# Patient Record
Sex: Female | Born: 1970 | Race: White | Hispanic: No | Marital: Married | Smoking: Current every day smoker
Health system: Southern US, Community
[De-identification: ages and names within clinical notes are randomized; demographics above are authoritative.]

## PROBLEM LIST (undated history)

## (undated) DIAGNOSIS — R002 Palpitations: Secondary | ICD-10-CM

## (undated) DIAGNOSIS — N946 Dysmenorrhea, unspecified: Secondary | ICD-10-CM

## (undated) DIAGNOSIS — R0602 Shortness of breath: Secondary | ICD-10-CM

## (undated) DIAGNOSIS — E559 Vitamin D deficiency, unspecified: Secondary | ICD-10-CM

## (undated) DIAGNOSIS — M2550 Pain in unspecified joint: Secondary | ICD-10-CM

## (undated) DIAGNOSIS — N912 Amenorrhea, unspecified: Secondary | ICD-10-CM

## (undated) DIAGNOSIS — M549 Dorsalgia, unspecified: Secondary | ICD-10-CM

## (undated) DIAGNOSIS — K5900 Constipation, unspecified: Secondary | ICD-10-CM

## (undated) DIAGNOSIS — F419 Anxiety disorder, unspecified: Secondary | ICD-10-CM

## (undated) DIAGNOSIS — R32 Unspecified urinary incontinence: Secondary | ICD-10-CM

## (undated) HISTORY — DX: Amenorrhea, unspecified: N91.2

## (undated) HISTORY — DX: Vitamin D deficiency, unspecified: E55.9

## (undated) HISTORY — DX: Dorsalgia, unspecified: M54.9

## (undated) HISTORY — PX: CHOLECYSTECTOMY: SHX55

## (undated) HISTORY — DX: Constipation, unspecified: K59.00

## (undated) HISTORY — PX: COLONOSCOPY: SHX174

## (undated) HISTORY — DX: Dysmenorrhea, unspecified: N94.6

## (undated) HISTORY — DX: Unspecified urinary incontinence: R32

## (undated) HISTORY — DX: Pain in unspecified joint: M25.50

## (undated) HISTORY — DX: Shortness of breath: R06.02

## (undated) HISTORY — DX: Palpitations: R00.2

## (undated) HISTORY — PX: TUBAL LIGATION: SHX77

## (undated) HISTORY — DX: Anxiety disorder, unspecified: F41.9

---

## 1998-03-10 ENCOUNTER — Ambulatory Visit (HOSPITAL_COMMUNITY): Admission: RE | Admit: 1998-03-10 | Discharge: 1998-03-10 | Payer: Self-pay | Admitting: Obstetrics and Gynecology

## 1998-04-14 ENCOUNTER — Inpatient Hospital Stay (HOSPITAL_COMMUNITY): Admission: AD | Admit: 1998-04-14 | Discharge: 1998-04-14 | Payer: Self-pay | Admitting: Obstetrics & Gynecology

## 1998-06-05 ENCOUNTER — Ambulatory Visit (HOSPITAL_COMMUNITY): Admission: RE | Admit: 1998-06-05 | Discharge: 1998-06-05 | Payer: Self-pay | Admitting: Obstetrics and Gynecology

## 1998-06-10 ENCOUNTER — Inpatient Hospital Stay (HOSPITAL_COMMUNITY): Admission: AD | Admit: 1998-06-10 | Discharge: 1998-06-13 | Payer: Self-pay | Admitting: Obstetrics and Gynecology

## 1998-07-18 ENCOUNTER — Other Ambulatory Visit: Admission: RE | Admit: 1998-07-18 | Discharge: 1998-07-18 | Payer: Self-pay | Admitting: Obstetrics and Gynecology

## 1999-07-20 ENCOUNTER — Other Ambulatory Visit: Admission: RE | Admit: 1999-07-20 | Discharge: 1999-07-20 | Payer: Self-pay | Admitting: Obstetrics and Gynecology

## 2000-05-22 ENCOUNTER — Encounter: Admission: RE | Admit: 2000-05-22 | Discharge: 2000-05-22 | Payer: Self-pay | Admitting: Internal Medicine

## 2000-05-22 ENCOUNTER — Encounter: Payer: Self-pay | Admitting: Internal Medicine

## 2000-05-27 ENCOUNTER — Encounter: Payer: Self-pay | Admitting: Gastroenterology

## 2000-05-27 ENCOUNTER — Observation Stay (HOSPITAL_COMMUNITY): Admission: RE | Admit: 2000-05-27 | Discharge: 2000-05-28 | Payer: Self-pay

## 2000-11-10 ENCOUNTER — Other Ambulatory Visit: Admission: RE | Admit: 2000-11-10 | Discharge: 2000-11-10 | Payer: Self-pay | Admitting: Gastroenterology

## 2000-12-09 ENCOUNTER — Other Ambulatory Visit: Admission: RE | Admit: 2000-12-09 | Discharge: 2000-12-09 | Payer: Self-pay | Admitting: Obstetrics and Gynecology

## 2001-09-09 ENCOUNTER — Other Ambulatory Visit: Admission: RE | Admit: 2001-09-09 | Discharge: 2001-09-09 | Payer: Self-pay | Admitting: Obstetrics and Gynecology

## 2003-04-14 ENCOUNTER — Inpatient Hospital Stay (HOSPITAL_COMMUNITY): Admission: AD | Admit: 2003-04-14 | Discharge: 2003-04-30 | Payer: Self-pay | Admitting: Obstetrics and Gynecology

## 2003-04-14 ENCOUNTER — Encounter: Payer: Self-pay | Admitting: Obstetrics and Gynecology

## 2003-04-16 ENCOUNTER — Encounter: Payer: Self-pay | Admitting: Obstetrics and Gynecology

## 2003-04-18 ENCOUNTER — Encounter: Payer: Self-pay | Admitting: Obstetrics and Gynecology

## 2003-04-22 ENCOUNTER — Encounter: Payer: Self-pay | Admitting: Obstetrics and Gynecology

## 2003-04-26 ENCOUNTER — Encounter: Payer: Self-pay | Admitting: Obstetrics and Gynecology

## 2003-06-09 ENCOUNTER — Other Ambulatory Visit: Admission: RE | Admit: 2003-06-09 | Discharge: 2003-06-09 | Payer: Self-pay | Admitting: Obstetrics and Gynecology

## 2005-02-09 ENCOUNTER — Inpatient Hospital Stay (HOSPITAL_COMMUNITY): Admission: AD | Admit: 2005-02-09 | Discharge: 2005-02-20 | Payer: Self-pay | Admitting: Obstetrics

## 2005-02-21 ENCOUNTER — Encounter: Admission: RE | Admit: 2005-02-21 | Discharge: 2005-03-23 | Payer: Self-pay | Admitting: Obstetrics & Gynecology

## 2005-04-21 ENCOUNTER — Encounter: Admission: RE | Admit: 2005-04-21 | Discharge: 2005-05-21 | Payer: Self-pay | Admitting: Obstetrics & Gynecology

## 2005-06-21 ENCOUNTER — Encounter: Admission: RE | Admit: 2005-06-21 | Discharge: 2005-07-21 | Payer: Self-pay | Admitting: Obstetrics & Gynecology

## 2005-08-21 ENCOUNTER — Encounter: Admission: RE | Admit: 2005-08-21 | Discharge: 2005-08-28 | Payer: Self-pay | Admitting: Obstetrics & Gynecology

## 2012-10-21 DIAGNOSIS — R896 Abnormal cytological findings in specimens from other organs, systems and tissues: Secondary | ICD-10-CM | POA: Insufficient documentation

## 2014-03-31 ENCOUNTER — Telehealth: Payer: Self-pay

## 2014-03-31 NOTE — Telephone Encounter (Signed)
Unable to reach patient.  Invalid number.  NEW PATIENT

## 2014-04-04 ENCOUNTER — Ambulatory Visit: Payer: Self-pay | Admitting: Family Medicine

## 2014-04-04 DIAGNOSIS — Z0289 Encounter for other administrative examinations: Secondary | ICD-10-CM

## 2014-04-04 NOTE — Telephone Encounter (Signed)
No show, no call.

## 2014-11-30 ENCOUNTER — Ambulatory Visit (INDEPENDENT_AMBULATORY_CARE_PROVIDER_SITE_OTHER): Payer: No Typology Code available for payment source | Admitting: Emergency Medicine

## 2014-11-30 VITALS — BP 124/80 | HR 81 | Temp 98.4°F | Resp 16 | Ht 66.75 in | Wt 173.6 lb

## 2014-11-30 DIAGNOSIS — J40 Bronchitis, not specified as acute or chronic: Secondary | ICD-10-CM

## 2014-11-30 MED ORDER — BENZONATATE 100 MG PO CAPS: 100.0000 mg | ORAL_CAPSULE | Freq: Three times a day (TID) | ORAL | Status: DC | PRN

## 2014-11-30 MED ORDER — AZITHROMYCIN 250 MG PO TABS: ORAL_TABLET | ORAL | Status: DC

## 2014-11-30 NOTE — Progress Notes (Signed)
   Subjective:  This chart was scribed for Remo Lipps A. Fermin Yan MD by Molli Posey, Medical scribe. This patient was seen in ROOM 1 and the patient's care was started 2:42 PM.   Patient ID: Cheryl Watson, female    DOB: 1971/10/23, 43 y.o.   MRN: [id number]   Chief Complaint  Patient presents with  . Flu Vaccine  . Fever    up to 100 last night  . Sinusitis    x2 days  . Cough    greenish sputum    HPI HPI Comments: Cheryl Watson is a 43 y.o. female who presents to [facility] complaining of sinus pressure that started 2 days ago. She reports an associated, worsening productive cough with thick green phlegm. Pt reports she sometimes feels like she is wheezing. She states that she had a fever last night. She says she took some advil cold and sinus that provided minimal relief to her symptoms. Pt states she has never had to use an inhaler for prior episodes. Pt reports she has a history of bronchitis and gets it about once a year. She says that her son was sick last week. She says that she has taken a Z-pak in the past with success. Patient reports she smokes 0.5ppd.   There are no active problems to display for this patient.  History reviewed. No pertinent past medical history. No current outpatient prescriptions on file prior to visit.   No current facility-administered medications on file prior to visit.   Allergies  Allergen Reactions  . Penicillins     All cilin drugs   Review of Systems  Constitutional: Positive for fever.  HENT: Positive for congestion.   Respiratory: Positive for cough.       Objective:   Physical Exam  Constitutional: She is oriented to person, place, and time. She appears well-developed and well-nourished.  HENT:  Head: Normocephalic and atraumatic.  Right Ear: External ear normal.  Left Ear: External ear normal.  Mouth/Throat: Oropharynx is clear and moist.  Nasal congestion.   Neck: Neck supple. No tracheal deviation present.  Cardiovascular:  Normal rate.   Pulmonary/Chest: Effort normal. No respiratory distress. She has no wheezes.  Few rhonchi on the right. Breath sounds symmetrical.   Abdominal: She exhibits no distension.  Neurological: She is alert and oriented to person, place, and time.  Skin: Skin is warm and dry.  Psychiatric: She has a normal mood and affect. Her behavior is normal.  Nursing note and vitals reviewed.  Filed Vitals:   11/30/14 1402  BP: 124/80  Pulse: 81  Temp: 98.4 F (36.9 C)  TempSrc: Oral  Resp: 16  Height: 5' 6.75" (1.695 m)  Weight: 173 lb 9.6 oz (78.744 kg)  SpO2: 99%   Meds ordered this encounter  Medications  . azithromycin (ZITHROMAX) 250 MG tablet    Sig: Take 2 tabs PO x 1 dose, then 1 tab PO QD x 4 days    Dispense:  6 tablet    Refill:  0  . benzonatate (TESSALON) 100 MG capsule    Sig: Take 1-2 capsules (100-200 mg total) by mouth 3 (three) times daily as needed for cough.    Dispense:  40 capsule    Refill:  0      Assessment & Plan:  We'll treat with Tessalon Perles and Zithromax.I personally performed the services described in this documentation, which was scribed in my presence. The recorded information has been reviewed and is accurate.

## 2014-11-30 NOTE — Progress Notes (Deleted)
   Subjective:    Patient ID: Cheryl Watson, female    DOB: 17-Apr-1971, 43 y.o.   MRN: [id number]  HPI    Review of Systems     Objective:   Physical Exam        Assessment & Plan:

## 2014-11-30 NOTE — Patient Instructions (Signed)

## 2015-08-09 ENCOUNTER — Telehealth: Payer: Self-pay | Admitting: *Deleted

## 2015-08-09 NOTE — Telephone Encounter (Signed)
Unable to reach patient at time of Pre-Visit Call.  Left message for patient to return call when available.    

## 2015-08-10 ENCOUNTER — Ambulatory Visit: Payer: Self-pay | Admitting: Family Medicine

## 2015-10-22 ENCOUNTER — Ambulatory Visit (INDEPENDENT_AMBULATORY_CARE_PROVIDER_SITE_OTHER): Payer: PRIVATE HEALTH INSURANCE | Admitting: Internal Medicine

## 2015-10-22 VITALS — BP 110/66 | HR 94 | Temp 98.4°F | Resp 20 | Ht 66.0 in | Wt 171.8 lb

## 2015-10-22 DIAGNOSIS — N393 Stress incontinence (female) (male): Secondary | ICD-10-CM

## 2015-10-22 DIAGNOSIS — R32 Unspecified urinary incontinence: Secondary | ICD-10-CM

## 2015-10-22 LAB — POCT WET PREP WITH KOH
CLUE CELLS WET PREP PER HPF POC: NEGATIVE
KOH Prep POC: NEGATIVE
RBC WET PREP PER HPF POC: NEGATIVE
TRICHOMONAS UA: NEGATIVE
Yeast Wet Prep HPF POC: NEGATIVE

## 2015-10-22 LAB — POCT URINALYSIS DIPSTICK
Bilirubin, UA: NEGATIVE
Glucose, UA: NEGATIVE
Ketones, UA: NEGATIVE
Leukocytes, UA: NEGATIVE
NITRITE UA: NEGATIVE
PH UA: 7
Protein, UA: NEGATIVE
Spec Grav, UA: 1.015
UROBILINOGEN UA: 0.2

## 2015-10-22 LAB — POCT UA - MICROSCOPIC ONLY
CASTS, UR, LPF, POC: ABSENT
Crystals, Ur, HPF, POC: ABSENT
Mucus, UA: ABSENT
YEAST UA: ABSENT

## 2015-10-22 NOTE — Progress Notes (Signed)
Subjective:  This chart was scribed for Tami Lin, MD by Thea Alken, ED Scribe. This patient was seen in room 4 and the patient's care was started at 2:06 PM.   Patient ID: Cheryl Watson, female    DOB: Jul 31, 1971, 44 y.o.   MRN: [id number]  HPI Chief Complaint  Patient presents with  . OTHER    bladder infection, yesterday  . Dysmenorrhea     x4 days  . Flu Vaccine   HPI Comments: Cheryl Watson is a 44 y.o. female who presents to the [facility] complaining of a bladder issue. pt states within the past couple weeks she's had bladder incontinence consisting of leaking urine when changing position, sitting to standing. States for 2-3 days she's had some abdominal cramping, low back discomfort, vaginal discomfort, urinary frequency, and bladder fullness. Today while sitting up on the toilet he felt that something had protruded into her vaginal area that she could feel with her fingers. Her husband passed away 2 y ago, and she has just resumed sexual activity within the last 3 months.She denies dyspareunia, discharge.  Last pap smear was 2 years. LMP-October 19th. She has hx of tubal ligation.   She denies hx of back pain or leg weakness/numbness. No constip or diarr.  History reviewed. No pertinent past medical history. Allergies  Allergen Reactions  . Penicillins     All cilin drugs   Prior to Admission medications =none  Medication Sig Start Date End Date Taking? Authorizing Provider   Review of Systems  Constitutional: Negative for fever and chills.  Genitourinary: Positive for frequency, vaginal discharge and enuresis. Negative for dysuria, vaginal bleeding, vaginal pain and dyspareunia.  Musculoskeletal: Positive for myalgias and back pain.  Neurological: Negative for weakness.       Objective:   Physical Exam  Constitutional: She is oriented to person, place, and time. She appears well-developed and well-nourished. No distress.  HENT:    Head: Normocephalic.  Cardiovascular: Normal rate.   Pulmonary/Chest: Effort normal.  Abdominal: Bowel sounds are normal. She exhibits no distension and no mass. There is no tenderness. There is no rebound.  Genitourinary:  intr clear Vault without prolapse lying Oc clear Uterus midposition, nontender No adnex masses or tend  Musculoskeletal: Normal range of motion.  Neurological: She is alert and oriented to person, place, and time.  Skin: Skin is warm and dry.  Psychiatric: She has a normal mood and affect. Her behavior is normal.  Nursing note and vitals reviewed.    Filed Vitals:   10/22/15 1329  BP: 110/66  Pulse: 94  Temp: 98.4 F (36.9 C)  TempSrc: Oral  Resp: 20  Height: 5\' 6"  (1.676 m)  Weight: 171 lb 12.8 oz (77.928 kg)  SpO2: 98%     Results for orders placed or performed in visit on 10/22/15  POCT UA - Microscopic Only  Result Value Ref Range   WBC, Ur, HPF, POC Few    RBC, urine, microscopic None    Bacteria, U Microscopic Few    Mucus, UA Absent    Epithelial cells, urine per micros Moderate    Crystals, Ur, HPF, POC Absent    Casts, Ur, LPF, POC Absent    Yeast, UA Absent   POCT urinalysis dipstick  Result Value Ref Range   Color, UA yellow    Clarity, UA clear    Glucose, UA negatve    Bilirubin, UA negative    Ketones, UA negative  Spec Grav, UA 1.015    Blood, UA trace-lysed    pH, UA 7.0    Protein, UA negative    Urobilinogen, UA 0.2    Nitrite, UA negative    Leukocytes, UA Negative Negative  POCT Wet Prep with KOH  Result Value Ref Range   Trichomonas, UA Negative    Clue Cells Wet Prep HPF POC Negative    Epithelial Wet Prep HPF POC Many Few, Moderate, Many, Too numerous to count   Yeast Wet Prep HPF POC Negative    Bacteria Wet Prep HPF POC Moderate (A) None, Few, Too numerous to count   RBC Wet Prep HPF POC Negative    WBC Wet Prep HPF POC Few    KOH Prep POC Negative     Assessment & Plan:    1. Incontinence in female    new onset  The clinical history suggests that she has a uterine prolapse and we will refer her to gynecology for evaluation   I have completed the patient encounter in its entirety as documented by the scribe, with editing by me where necessary. Judee Hennick P. Laney Pastor, M.D.

## 2015-11-01 ENCOUNTER — Ambulatory Visit (INDEPENDENT_AMBULATORY_CARE_PROVIDER_SITE_OTHER): Payer: PRIVATE HEALTH INSURANCE | Admitting: Women's Health

## 2015-11-01 ENCOUNTER — Other Ambulatory Visit (HOSPITAL_COMMUNITY)
Admission: RE | Admit: 2015-11-01 | Discharge: 2015-11-01 | Disposition: A | Discharge status: Home / Self Care | Payer: Managed Care, Other (non HMO) | Source: Ambulatory Visit | Attending: Women's Health | Admitting: Women's Health

## 2015-11-01 ENCOUNTER — Encounter: Payer: Self-pay | Admitting: Women's Health

## 2015-11-01 VITALS — BP 124/80 | Ht 66.0 in | Wt 173.0 lb

## 2015-11-01 DIAGNOSIS — Z1151 Encounter for screening for human papillomavirus (HPV): Secondary | ICD-10-CM | POA: Insufficient documentation

## 2015-11-01 DIAGNOSIS — Z23 Encounter for immunization: Secondary | ICD-10-CM

## 2015-11-01 DIAGNOSIS — Z01411 Encounter for gynecological examination (general) (routine) with abnormal findings: Secondary | ICD-10-CM | POA: Insufficient documentation

## 2015-11-01 DIAGNOSIS — Z01419 Encounter for gynecological examination (general) (routine) without abnormal findings: Secondary | ICD-10-CM | POA: Diagnosis not present

## 2015-11-01 DIAGNOSIS — Z113 Encounter for screening for infections with a predominantly sexual mode of transmission: Secondary | ICD-10-CM

## 2015-11-01 LAB — COMPREHENSIVE METABOLIC PANEL
ALBUMIN: 4 g/dL (ref 3.6–5.1)
ALK PHOS: 52 U/L (ref 33–115)
ALT: 13 U/L (ref 6–29)
AST: 16 U/L (ref 10–35)
BUN: 15 mg/dL (ref 7–25)
CALCIUM: 8.8 mg/dL (ref 8.6–10.2)
CHLORIDE: 105 mmol/L (ref 98–110)
CO2: 26 mmol/L (ref 20–31)
Creat: 0.6 mg/dL (ref 0.50–1.10)
Glucose, Bld: 76 mg/dL (ref 65–99)
POTASSIUM: 4 mmol/L (ref 3.5–5.3)
Sodium: 139 mmol/L (ref 135–146)
TOTAL PROTEIN: 6.3 g/dL (ref 6.1–8.1)
Total Bilirubin: 0.3 mg/dL (ref 0.2–1.2)

## 2015-11-01 LAB — CBC WITH DIFFERENTIAL/PLATELET
BASOS ABS: 0.1 10*3/uL (ref 0.0–0.1)
Basophils Relative: 1 % (ref 0–1)
Eosinophils Absolute: 0.3 10*3/uL (ref 0.0–0.7)
Eosinophils Relative: 3 % (ref 0–5)
HCT: 37.4 % (ref 36.0–46.0)
Hemoglobin: 12.8 g/dL (ref 12.0–15.0)
LYMPHS ABS: 3.7 10*3/uL (ref 0.7–4.0)
Lymphocytes Relative: 35 % (ref 12–46)
MCH: 30.9 pg (ref 26.0–34.0)
MCHC: 34.2 g/dL (ref 30.0–36.0)
MCV: 90.3 fL (ref 78.0–100.0)
MPV: 10.7 fL (ref 8.6–12.4)
Monocytes Absolute: 0.7 10*3/uL (ref 0.1–1.0)
Monocytes Relative: 7 % (ref 3–12)
NEUTROS PCT: 54 % (ref 43–77)
Neutro Abs: 5.7 10*3/uL (ref 1.7–7.7)
PLATELETS: 265 10*3/uL (ref 150–400)
RBC: 4.14 MIL/uL (ref 3.87–5.11)
RDW: 13.5 % (ref 11.5–15.5)
WBC: 10.6 10*3/uL — ABNORMAL HIGH (ref 4.0–10.5)

## 2015-11-01 LAB — LIPID PANEL
CHOL/HDL RATIO: 3.7 ratio (ref <=5.0)
CHOLESTEROL: 169 mg/dL (ref 125–200)
HDL: 46 mg/dL (ref >=46)
LDL Cholesterol: 96 mg/dL (ref <130)
TRIGLYCERIDES: 135 mg/dL (ref <150)
VLDL: 27 mg/dL (ref <30)

## 2015-11-01 LAB — TSH: TSH: 1.246 u[IU]/mL (ref 0.350–4.500)

## 2015-11-01 NOTE — Progress Notes (Signed)
Yan Okray Ring Apr 23, 1971 [id number]    History:    Presents for new patient annual exam.  Regular monthly cycle/BTL/new partner. One abnormal Pap many years ago normal after that. Normal mammograms overdue. Was treated for UTI 2 weeks ago and was noted to have a questionable uterine prolapse. Reports intermittent vaginal pressure, no urinary symptoms.  Past medical history, past surgical history, family history and social history were all reviewed and documented in the EPIC chart. Nurse in hospice. Has 3 children ages 60, 10, 47 all doing well. Widowed 2 years ago husband died of alcohol related cirrhosis. Engaged planning marriage in next year.  ROS:  A ROS was performed and pertinent positives and negatives are included.  Exam:  Filed Vitals:   11/01/15 1155  BP: 124/80    General appearance:  Normal Thyroid:  Symmetrical, normal in size, without palpable masses or nodularity. Respiratory  Auscultation:  Clear without wheezing or rhonchi Cardiovascular  Auscultation:  Regular rate, without rubs, murmurs or gallops  Edema/varicosities:  Not grossly evident Abdominal  Soft,nontender, without masses, guarding or rebound.  Liver/spleen:  No organomegaly noted  Hernia:  None appreciated  Skin  Inspection:  Grossly normal   Breasts: Examined lying and sitting.     Right: Without masses, retractions, discharge or axillary adenopathy.     Left: Without masses, retractions, discharge or axillary adenopathy. Gentitourinary   Inguinal/mons:  Normal without inguinal adenopathy  External genitalia: +1 cystocele with bearing down no visible prolapse, no leakage of urine  BUS/Urethra/Skene's glands:  Normal  Vagina:  Normal  Cervix:  Normal  Uterus:   normal in size, shape and contour.  Midline and mobile  Adnexa/parametria:     Rt: Without masses or tenderness.   Lt: Without masses or tenderness.  Anus and perineum: Normal  Digital rectal exam: Normal sphincter tone without palpated  masses or tenderness  Assessment/Plan:  44 y.o. WW at G3 P3  for annual exam.    Monthly cycle/BTL STD screen  Plan: SBE's, reviewed importance of annual screening mammogram. Regular exercise, calcium rich foods, vitamin D 1000 daily encouraged. CBC, CMP, lipid panel, TSH, UA, Pap with HR HPV typing, new screening guidelines reviewed. GC/Chlamydia, denies need for HIV, hepatitis or RPR. Reviewed importance of keeping exercise, emptying bladder prior to exercise, call if continued issues.   Huel Cote WHNP, 2:09 PM 11/01/2015

## 2015-11-01 NOTE — Patient Instructions (Signed)
Health Maintenance, Female Adopting a healthy lifestyle and getting preventive care can go a long way to promote health and wellness. Talk with your health care provider about what schedule of regular examinations is right for you. This is a good chance for you to check in with your provider about disease prevention and staying healthy. In between checkups, there are plenty of things you can do on your own. Experts have done a lot of research about which lifestyle changes and preventive measures are most likely to keep you healthy. Ask your health care provider for more information. WEIGHT AND DIET  Eat a healthy diet  Be sure to include plenty of vegetables, fruits, low-fat dairy products, and lean protein.  Do not eat a lot of foods high in solid fats, added sugars, or salt.  Get regular exercise. This is one of the most important things you can do for your health.  Most adults should exercise for at least 150 minutes each week. The exercise should increase your heart rate and make you sweat (moderate-intensity exercise).  Most adults should also do strengthening exercises at least twice a week. This is in addition to the moderate-intensity exercise.  Maintain a healthy weight  Body mass index (BMI) is a measurement that can be used to identify possible weight problems. It estimates body fat based on height and weight. Your health care provider can help determine your BMI and help you achieve or maintain a healthy weight.  For females 20 years of age and older:   A BMI below 18.5 is considered underweight.  A BMI of 18.5 to 24.9 is normal.  A BMI of 25 to 29.9 is considered overweight.  A BMI of 30 and above is considered obese.  Watch levels of cholesterol and blood lipids  You should start having your blood tested for lipids and cholesterol at 45 years of age, then have this test every 5 years.  You may need to have your cholesterol levels checked more often if:  Your lipid  or cholesterol levels are high.  You are older than 45 years of age.  You are at high risk for heart disease.  CANCER SCREENING   Lung Cancer  Lung cancer screening is recommended for adults 55-80 years old who are at high risk for lung cancer because of a history of smoking.  A yearly low-dose CT scan of the lungs is recommended for people who:  Currently smoke.  Have quit within the past 15 years.  Have at least a 30-pack-year history of smoking. A pack year is smoking an average of one pack of cigarettes a day for 1 year.  Yearly screening should continue until it has been 15 years since you quit.  Yearly screening should stop if you develop a health problem that would prevent you from having lung cancer treatment.  Breast Cancer  Practice breast self-awareness. This means understanding how your breasts normally appear and feel.  It also means doing regular breast self-exams. Let your health care provider know about any changes, no matter how small.  If you are in your 20s or 30s, you should have a clinical breast exam (CBE) by a health care provider every 1-3 years as part of a regular health exam.  If you are 40 or older, have a CBE every year. Also consider having a breast X-ray (mammogram) every year.  If you have a family history of breast cancer, talk to your health care provider about genetic screening.  If you   are at high risk for breast cancer, talk to your health care provider about having an MRI and a mammogram every year.  Breast cancer gene (BRCA) assessment is recommended for women who have family members with BRCA-related cancers. BRCA-related cancers include:  Breast.  Ovarian.  Tubal.  Peritoneal cancers.  Results of the assessment will determine the need for genetic counseling and BRCA1 and BRCA2 testing. Cervical Cancer Your health care provider may recommend that you be screened regularly for cancer of the pelvic organs (ovaries, uterus, and  vagina). This screening involves a pelvic examination, including checking for microscopic changes to the surface of your cervix (Pap test). You may be encouraged to have this screening done every 3 years, beginning at age 73.  For women ages 44-65, health care providers may recommend pelvic exams and Pap testing every 3 years, or they may recommend the Pap and pelvic exam, combined with testing for human papilloma virus (HPV), every 5 years. Some types of HPV increase your risk of cervical cancer. Testing for HPV may also be done on women of any age with unclear Pap test results.  Other health care providers may not recommend any screening for nonpregnant women who are considered low risk for pelvic cancer and who do not have symptoms. Ask your health care provider if a screening pelvic exam is right for you.  If you have had past treatment for cervical cancer or a condition that could lead to cancer, you need Pap tests and screening for cancer for at least 20 years after your treatment. If Pap tests have been discontinued, your risk factors (such as having a new sexual partner) need to be reassessed to determine if screening should resume. Some women have medical problems that increase the chance of getting cervical cancer. In these cases, your health care provider may recommend more frequent screening and Pap tests. Colorectal Cancer  This type of cancer can be detected and often prevented.  Routine colorectal cancer screening usually begins at 45 years of age and continues through 45 years of age.  Your health care provider may recommend screening at an earlier age if you have risk factors for colon cancer.  Your health care provider may also recommend using home test kits to check for hidden blood in the stool.  A small camera at the end of a tube can be used to examine your colon directly (sigmoidoscopy or colonoscopy). This is done to check for the earliest forms of colorectal  cancer.  Routine screening usually begins at age 74.  Direct examination of the colon should be repeated every 5-10 years through 45 years of age. However, you may need to be screened more often if early forms of precancerous polyps or small growths are found. Skin Cancer  Check your skin from head to toe regularly.  Tell your health care provider about any new moles or changes in moles, especially if there is a change in a mole's shape or color.  Also tell your health care provider if you have a mole that is larger than the size of a pencil eraser.  Always use sunscreen. Apply sunscreen liberally and repeatedly throughout the day.  Protect yourself by wearing long sleeves, pants, a wide-brimmed hat, and sunglasses whenever you are outside. HEART DISEASE, DIABETES, AND HIGH BLOOD PRESSURE   High blood pressure causes heart disease and increases the risk of stroke. High blood pressure is more likely to develop in:  People who have blood pressure in the high end  of the normal range (130-139/85-89 mm Hg).  People who are overweight or obese.  People who are African American.  If you are 82-43 years of age, have your blood pressure checked every 3-5 years. If you are 58 years of age or older, have your blood pressure checked every year. You should have your blood pressure measured twice--once when you are at a hospital or clinic, and once when you are not at a hospital or clinic. Record the average of the two measurements. To check your blood pressure when you are not at a hospital or clinic, you can use:  An automated blood pressure machine at a pharmacy.  A home blood pressure monitor.  If you are between 96 years and 37 years old, ask your health care provider if you should take aspirin to prevent strokes.  Have regular diabetes screenings. This involves taking a blood sample to check your fasting blood sugar level.  If you are at a normal weight and have a low risk for diabetes,  have this test once every three years after 45 years of age.  If you are overweight and have a high risk for diabetes, consider being tested at a younger age or more often. PREVENTING INFECTION  Hepatitis B  If you have a higher risk for hepatitis B, you should be screened for this virus. You are considered at high risk for hepatitis B if:  You were born in a country where hepatitis B is common. Ask your health care provider which countries are considered high risk.  Your parents were born in a high-risk country, and you have not been immunized against hepatitis B (hepatitis B vaccine).  You have HIV or AIDS.  You use needles to inject street drugs.  You live with someone who has hepatitis B.  You have had sex with someone who has hepatitis B.  You get hemodialysis treatment.  You take certain medicines for conditions, including cancer, organ transplantation, and autoimmune conditions. Hepatitis C  Blood testing is recommended for:  Everyone born from [birth year] through 1966.  Anyone with known risk factors for hepatitis C. Sexually transmitted infections (STIs)  You should be screened for sexually transmitted infections (STIs) including gonorrhea and chlamydia if:  You are sexually active and are younger than 45 years of age.  You are older than 46 years of age and your health care provider tells you that you are at risk for this type of infection.  Your sexual activity has changed since you were last screened and you are at an increased risk for chlamydia or gonorrhea. Ask your health care provider if you are at risk.  If you do not have HIV, but are at risk, it may be recommended that you take a prescription medicine daily to prevent HIV infection. This is called pre-exposure prophylaxis (PrEP). You are considered at risk if:  You are sexually active and do not regularly use condoms or know the HIV status of your partner(s).  You take drugs by injection.  You are sexually  active with a partner who has HIV. Talk with your health care provider about whether you are at high risk of being infected with HIV. If you choose to begin PrEP, you should first be tested for HIV. You should then be tested every 3 months for as long as you are taking PrEP.  PREGNANCY   If you are premenopausal and you may become pregnant, ask your health care provider about preconception counseling.  If you may  become pregnant, take 400 to 800 micrograms (mcg) of folic acid every day.  If you want to prevent pregnancy, talk to your health care provider about birth control (contraception). OSTEOPOROSIS AND MENOPAUSE   Osteoporosis is a disease in which the bones lose minerals and strength with aging. This can result in serious bone fractures. Your risk for osteoporosis can be identified using a bone density scan.  If you are 61 years of age or older, or if you are at risk for osteoporosis and fractures, ask your health care provider if you should be screened.  Ask your health care provider whether you should take a calcium or vitamin D supplement to lower your risk for osteoporosis.  Menopause may have certain physical symptoms and risks.  Hormone replacement therapy may reduce some of these symptoms and risks. Talk to your health care provider about whether hormone replacement therapy is right for you.  HOME CARE INSTRUCTIONS   Schedule regular health, dental, and eye exams.  Stay current with your immunizations.   Do not use any tobacco products including cigarettes, chewing tobacco, or electronic cigarettes.  If you are pregnant, do not drink alcohol.  If you are breastfeeding, limit how much and how often you drink alcohol.  Limit alcohol intake to no more than 1 drink per day for nonpregnant women. One drink equals 12 ounces of beer, 5 ounces of wine, or 1 ounces of hard liquor.  Do not use street drugs.  Do not share needles.  Ask your health care provider for help if  you need support or information about quitting drugs.  Tell your health care provider if you often feel depressed.  Tell your health care provider if you have ever been abused or do not feel safe at home.   This information is not intended to replace advice given to you by your health care provider. Make sure you discuss any questions you have with your health care provider.   Document Released: 07/01/2011 Document Revised: 01/06/2015 Document Reviewed: 11/17/2013 Elsevier Interactive Patient Education [employer].

## 2015-11-02 LAB — URINALYSIS W MICROSCOPIC + REFLEX CULTURE
Bacteria, UA: NONE SEEN [HPF]
Bilirubin Urine: NEGATIVE
Casts: NONE SEEN [LPF]
Crystals: NONE SEEN [HPF]
Glucose, UA: NEGATIVE
Hgb urine dipstick: NEGATIVE
KETONES UR: NEGATIVE
Leukocytes, UA: NEGATIVE
Nitrite: NEGATIVE
PH: 7 (ref 5.0–8.0)
Protein, ur: NEGATIVE
RBC / HPF: NONE SEEN RBC/HPF (ref <=2)
SPECIFIC GRAVITY, URINE: 1.007 (ref 1.001–1.035)
WBC, UA: NONE SEEN WBC/HPF (ref <=5)
Yeast: NONE SEEN [HPF]

## 2015-11-02 LAB — GC/CHLAMYDIA PROBE AMP
CT Probe RNA: NEGATIVE
GC Probe RNA: NEGATIVE

## 2015-11-03 LAB — CYTOLOGY - PAP

## 2016-04-01 ENCOUNTER — Other Ambulatory Visit: Payer: PRIVATE HEALTH INSURANCE | Admitting: Internal Medicine

## 2016-04-01 DIAGNOSIS — Z1321 Encounter for screening for nutritional disorder: Secondary | ICD-10-CM

## 2016-04-01 DIAGNOSIS — Z0000 Encounter for general adult medical examination without abnormal findings: Secondary | ICD-10-CM

## 2016-04-01 DIAGNOSIS — Z130 Encounter for screening for diseases of the blood and blood-forming organs and certain disorders involving the immune mechanism: Secondary | ICD-10-CM

## 2016-04-01 DIAGNOSIS — Z13220 Encounter for screening for lipoid disorders: Secondary | ICD-10-CM

## 2016-04-01 DIAGNOSIS — Z1329 Encounter for screening for other suspected endocrine disorder: Secondary | ICD-10-CM

## 2016-04-01 LAB — CBC WITH DIFFERENTIAL/PLATELET
Basophils Absolute: 71 cells/uL (ref 0–200)
Basophils Relative: 1 %
EOS PCT: 3 %
Eosinophils Absolute: 213 cells/uL (ref 15–500)
HEMATOCRIT: 40 % (ref 35.0–45.0)
Hemoglobin: 13.1 g/dL (ref 11.7–15.5)
LYMPHS PCT: 30 %
Lymphs Abs: 2130 cells/uL (ref 850–3900)
MCH: 30 pg (ref 27.0–33.0)
MCHC: 32.8 g/dL (ref 32.0–36.0)
MCV: 91.5 fL (ref 80.0–100.0)
MONOS PCT: 9 %
MPV: 11 fL (ref 7.5–12.5)
Monocytes Absolute: 639 cells/uL (ref 200–950)
NEUTROS PCT: 57 %
Neutro Abs: 4047 cells/uL (ref 1500–7800)
PLATELETS: 259 10*3/uL (ref 140–400)
RBC: 4.37 MIL/uL (ref 3.80–5.10)
RDW: 13.4 % (ref 11.0–15.0)
WBC: 7.1 10*3/uL (ref 3.8–10.8)

## 2016-04-01 LAB — COMPLETE METABOLIC PANEL WITH GFR
ALT: 16 U/L (ref 6–29)
AST: 16 U/L (ref 10–35)
Albumin: 4.1 g/dL (ref 3.6–5.1)
Alkaline Phosphatase: 44 U/L (ref 33–115)
BILIRUBIN TOTAL: 0.6 mg/dL (ref 0.2–1.2)
BUN: 13 mg/dL (ref 7–25)
CALCIUM: 8.7 mg/dL (ref 8.6–10.2)
CHLORIDE: 107 mmol/L (ref 98–110)
CO2: 22 mmol/L (ref 20–31)
CREATININE: 0.58 mg/dL (ref 0.50–1.10)
GFR, Est African American: >89 mL/min (ref >=60)
GFR, Est Non African American: >89 mL/min (ref >=60)
Glucose, Bld: 94 mg/dL (ref 65–99)
Potassium: 4.3 mmol/L (ref 3.5–5.3)
SODIUM: 138 mmol/L (ref 135–146)
Total Protein: 6.3 g/dL (ref 6.1–8.1)

## 2016-04-01 LAB — TSH: TSH: 1.19 mIU/L

## 2016-04-01 LAB — LIPID PANEL
CHOLESTEROL: 164 mg/dL (ref 125–200)
HDL: 50 mg/dL (ref >=46)
LDL CALC: 98 mg/dL (ref <130)
Total CHOL/HDL Ratio: 3.3 Ratio (ref <=5.0)
Triglycerides: 78 mg/dL (ref <150)
VLDL: 16 mg/dL (ref <30)

## 2016-04-02 LAB — VITAMIN D 25 HYDROXY (VIT D DEFICIENCY, FRACTURES): VIT D 25 HYDROXY: 27 ng/mL — AB (ref 30–100)

## 2016-04-09 ENCOUNTER — Ambulatory Visit (INDEPENDENT_AMBULATORY_CARE_PROVIDER_SITE_OTHER): Payer: Managed Care, Other (non HMO) | Admitting: Internal Medicine

## 2016-04-09 ENCOUNTER — Encounter: Payer: Self-pay | Admitting: Internal Medicine

## 2016-04-09 ENCOUNTER — Ambulatory Visit
Admission: RE | Admit: 2016-04-09 | Discharge: 2016-04-09 | Disposition: A | Discharge status: Home / Self Care | Payer: Managed Care, Other (non HMO) | Source: Ambulatory Visit | Attending: Internal Medicine | Admitting: Internal Medicine

## 2016-04-09 VITALS — BP 124/80 | HR 80 | Temp 98.1°F | Resp 18 | Ht 66.0 in | Wt 177.0 lb

## 2016-04-09 DIAGNOSIS — R0602 Shortness of breath: Secondary | ICD-10-CM

## 2016-04-09 DIAGNOSIS — G5603 Carpal tunnel syndrome, bilateral upper limbs: Secondary | ICD-10-CM

## 2016-04-09 DIAGNOSIS — Z87891 Personal history of nicotine dependence: Secondary | ICD-10-CM

## 2016-04-09 DIAGNOSIS — R05 Cough: Secondary | ICD-10-CM | POA: Diagnosis not present

## 2016-04-09 DIAGNOSIS — Z720 Tobacco use: Secondary | ICD-10-CM

## 2016-04-09 DIAGNOSIS — E559 Vitamin D deficiency, unspecified: Secondary | ICD-10-CM | POA: Diagnosis not present

## 2016-04-09 DIAGNOSIS — J209 Acute bronchitis, unspecified: Secondary | ICD-10-CM

## 2016-04-09 DIAGNOSIS — R059 Cough, unspecified: Secondary | ICD-10-CM

## 2016-04-09 DIAGNOSIS — Z0000 Encounter for general adult medical examination without abnormal findings: Secondary | ICD-10-CM

## 2016-04-09 MED ORDER — DOXYCYCLINE HYCLATE 100 MG PO TABS: 100.0000 mg | ORAL_TABLET | Freq: Two times a day (BID) | ORAL | Status: DC

## 2016-04-09 MED ORDER — BUPROPION HCL ER (XL) 150 MG PO TB24: 150.0000 mg | ORAL_TABLET | Freq: Every day | ORAL | Status: DC

## 2016-04-09 NOTE — Progress Notes (Signed)
   Subjective:    Patient ID: Cheryl Watson, female    DOB: Jun 22, 1971, 45 y.o.   MRN: [id number]  HPI 45 year old White Female presents to the office for the first time today. She was referred by Cheryl Watson. Cheryl Watson has noticed increased productive cough and dyspnea with exercise. Also having numbness in hands bilaterally.  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 She is a widow. She is smoked a pack of cigarettes daily for some 30 years. Rarely drinks alcohol. Doesn't get a lot of exercise but tries to walk around the neighborhood one or 2 times a week. Husband died of cirrhosis of the liver. She has a daughter age 67, son age 73 and another son age 25. Resides with Cheryl Watson her significant other.  Family history: Father age 16 disabled with severe peripheral neuropathy and brain atrophy related alcohol abuse. Mother 61 years old but she is estranged from her. No siblings.    Review of Systems     Objective:   Physical Exam  Constitutional: She is oriented to person, place, and time. She appears well-developed and well-nourished. No distress.  HENT:  Head: Normocephalic and atraumatic.  Right Ear: External ear normal.  Left Ear: External ear normal.  Mouth/Throat: Oropharynx is clear and moist. No oropharyngeal exudate.  Eyes: Conjunctivae and EOM are normal. Pupils are equal, round, and reactive to light. Right eye exhibits no discharge. Left eye exhibits no discharge.  Neck: Neck supple. No JVD present.  Cardiovascular: Normal rate, regular rhythm and normal heart sounds.   No murmur heard. Pulmonary/Chest: Effort normal and breath sounds normal. She has no wheezes. She has no rales.  Breasts normal female without masses  Abdominal:  Soft. Bowel sounds are normal. She exhibits no distension and no mass. There is no tenderness. There is no rebound and no guarding.  Genitourinary:  Deferred to nurse practitioner. GYN exam done Fall 2016  Musculoskeletal: She exhibits no edema.  Lymphadenopathy:    She has no cervical adenopathy.  Neurological: She is alert and oriented to person, place, and time. She has normal reflexes. No cranial nerve deficit.  Skin: Skin is warm and dry. No rash noted. She is not diaphoretic.  Psychiatric: She has a normal mood and affect. Her behavior is normal. Judgment and thought content normal.  Vitals reviewed.         Assessment & Plan:  Acute bronchitis with shortness of breath and cough. Pulse oximetry is normal. History of smoking. Patient is to have chest x-ray. Will take doxycycline 100 mg twice daily for 10 days for acute bronchitis. Smoking cessation counseling given. Trial of Wellbutrin XL 150 mg daily follow-up in 4 weeks. May need pulmonary function testing.  Bilateral carpal tunnel syndrome-referral to Dr. Amedeo Plenty  Vitamin D deficiency-recommend 2000 units vitamin D 3 daily  Addendum: Chest x-ray is negative and patient was notified

## 2016-04-09 NOTE — Patient Instructions (Addendum)
CXR for cough and Shortness of breath. PFTs advised. Wellbutrin XL 150 mg daily.  Return in 4 weeks. Doxycycline 100 mg twice daily for 10 days for acute bronchitis. Vitamin D3 2000 units daily for vitamin D deficiency

## 2016-04-10 ENCOUNTER — Other Ambulatory Visit: Payer: Self-pay | Admitting: Internal Medicine

## 2016-04-10 DIAGNOSIS — Z1231 Encounter for screening mammogram for malignant neoplasm of breast: Secondary | ICD-10-CM

## 2016-04-17 ENCOUNTER — Encounter (HOSPITAL_COMMUNITY): Payer: Managed Care, Other (non HMO)

## 2016-04-20 ENCOUNTER — Encounter: Payer: Self-pay | Admitting: Internal Medicine

## 2016-04-20 DIAGNOSIS — Z87891 Personal history of nicotine dependence: Secondary | ICD-10-CM | POA: Insufficient documentation

## 2016-04-20 DIAGNOSIS — G5603 Carpal tunnel syndrome, bilateral upper limbs: Secondary | ICD-10-CM | POA: Insufficient documentation

## 2016-05-06 ENCOUNTER — Ambulatory Visit: Payer: Managed Care, Other (non HMO)

## 2016-05-06 ENCOUNTER — Other Ambulatory Visit: Payer: Self-pay | Admitting: Internal Medicine

## 2016-05-07 ENCOUNTER — Ambulatory Visit: Payer: Managed Care, Other (non HMO) | Admitting: Internal Medicine

## 2016-06-06 ENCOUNTER — Other Ambulatory Visit: Payer: Self-pay | Admitting: Internal Medicine

## 2016-06-06 NOTE — Telephone Encounter (Signed)
Patient rescheduled her appt for 6/15. She states that she is ok on her medications right now and will last until she sees [place]

## 2016-06-06 NOTE — Telephone Encounter (Signed)
Did not keep follow up appt. Please call

## 2016-06-13 ENCOUNTER — Ambulatory Visit: Payer: Self-pay | Admitting: Internal Medicine

## 2016-06-17 ENCOUNTER — Ambulatory Visit (INDEPENDENT_AMBULATORY_CARE_PROVIDER_SITE_OTHER): Payer: Managed Care, Other (non HMO) | Admitting: Internal Medicine

## 2016-06-17 ENCOUNTER — Encounter: Payer: Self-pay | Admitting: Internal Medicine

## 2016-06-17 VITALS — BP 118/72 | HR 87 | Temp 97.9°F | Resp 18 | Ht 66.0 in | Wt 181.0 lb

## 2016-06-17 DIAGNOSIS — R059 Cough, unspecified: Secondary | ICD-10-CM

## 2016-06-17 DIAGNOSIS — Z716 Tobacco abuse counseling: Secondary | ICD-10-CM | POA: Diagnosis not present

## 2016-06-17 DIAGNOSIS — R7989 Other specified abnormal findings of blood chemistry: Secondary | ICD-10-CM | POA: Insufficient documentation

## 2016-06-17 DIAGNOSIS — Z720 Tobacco use: Secondary | ICD-10-CM | POA: Diagnosis not present

## 2016-06-17 DIAGNOSIS — Z87891 Personal history of nicotine dependence: Secondary | ICD-10-CM

## 2016-06-17 DIAGNOSIS — G5603 Carpal tunnel syndrome, bilateral upper limbs: Secondary | ICD-10-CM

## 2016-06-17 DIAGNOSIS — R05 Cough: Secondary | ICD-10-CM | POA: Diagnosis not present

## 2016-06-17 DIAGNOSIS — R945 Abnormal results of liver function studies: Secondary | ICD-10-CM | POA: Insufficient documentation

## 2016-06-17 NOTE — Patient Instructions (Signed)
Congratulations on quitting smoking. Continue to use nicotine patch and eventually try to wean off of it. Appointment with hand surgeon Dr. Phillip Heal 8 regarding bilateral carpal tunnel syndrome. Consider pulmonary function test in the near future. Order has been placed. Chest x-ray was negative. Not taking Wellbutrin at the present time.

## 2016-06-17 NOTE — Progress Notes (Signed)
   Subjective:    Patient ID: Cheryl Watson, female    DOB: December 13, 1971, 45 y.o.   MRN: [id number]  HPI Pleasant 45 year old Female seen for the first time April 11. Was diagnosed with bilateral carpal tunnel syndrome. Also was having issues with smoking with shortness of breath cough and acute bronchitis. Says she quit smoking on June 9. Wellbutrin was prescribed for her on initial visit to help with smoking cessation. She said it made her feel like everything was in slow motion. She did not take it for very long. She is now using a neck and nerve patch and doing well. Has noticed some issues with possible restless leg syndrome at night and wondering if it's related to nicotine patch. Told her she could remove nicotine patch prior to going to sleep and apply the next morning and see if symptoms improve. She is not had pulmonary function test yet. We discussed that previously. She thinks she will do that in the near future. Says cough has improved. Had x-ray of chest after initial evaluation April 11 which was negative.    Review of Systems no new symptoms     Objective:   Physical Exam  Chest is clear to auscultation without rales or wheezing      Assessment & Plan:  Nicotine dependence now on Nicoderm patch. Smoke free as of June 9 which is excellent. I congratulated her on this. Wellbutrin did not agree with her.  History of smoking  Cough and shortness of breath-suggest pulmonary function test in the near future. Had negative chest x-ray.  Plan: Return April 2018.

## 2016-07-22 ENCOUNTER — Ambulatory Visit: Payer: Managed Care, Other (non HMO)

## 2016-07-22 ENCOUNTER — Other Ambulatory Visit: Payer: Self-pay | Admitting: Hematology & Oncology

## 2016-08-29 ENCOUNTER — Ambulatory Visit (HOSPITAL_COMMUNITY)
Admission: RE | Admit: 2016-08-29 | Discharge: 2016-08-29 | Disposition: A | Discharge status: Home / Self Care | Payer: 59 | Source: Ambulatory Visit | Attending: Internal Medicine | Admitting: Internal Medicine

## 2016-08-29 DIAGNOSIS — R05 Cough: Secondary | ICD-10-CM | POA: Insufficient documentation

## 2016-08-29 DIAGNOSIS — R0602 Shortness of breath: Secondary | ICD-10-CM | POA: Insufficient documentation

## 2016-08-29 DIAGNOSIS — R059 Cough, unspecified: Secondary | ICD-10-CM

## 2016-08-29 LAB — PULMONARY FUNCTION TEST
DL/VA % pred: 77 %
DL/VA: 3.89 ml/min/mmHg/L
DLCO UNC % PRED: 73 %
DLCO unc: 19.91 ml/min/mmHg
FEF 25-75 PRE: 4.06 L/s
FEF 25-75 Post: 4.18 L/sec
FEF2575-%CHANGE-POST: 2 %
FEF2575-%Pred-Post: 138 %
FEF2575-%Pred-Pre: 134 %
FEV1-%Change-Post: 0 %
FEV1-%PRED-POST: 101 %
FEV1-%Pred-Pre: 101 %
FEV1-POST: 3.11 L
FEV1-PRE: 3.12 L
FEV1FVC-%CHANGE-POST: 3 %
FEV1FVC-%Pred-Pre: 104 %
FEV6-%CHANGE-POST: -3 %
FEV6-%PRED-POST: 94 %
FEV6-%PRED-PRE: 97 %
FEV6-POST: 3.54 L
FEV6-PRE: 3.67 L
FEV6FVC-%Change-Post: 0 %
FEV6FVC-%PRED-POST: 103 %
FEV6FVC-%PRED-PRE: 102 %
FVC-%CHANGE-POST: -3 %
FVC-%Pred-Post: 91 %
FVC-%Pred-Pre: 95 %
FVC-Post: 3.55 L
FVC-Pre: 3.67 L
POST FEV6/FVC RATIO: 100 %
Post FEV1/FVC ratio: 88 %
Pre FEV1/FVC ratio: 85 %
Pre FEV6/FVC Ratio: 100 %
RV % PRED: 123 %
RV: 2.23 L
TLC % pred: 111 %
TLC: 5.95 L

## 2016-08-29 MED ORDER — ALBUTEROL SULFATE (2.5 MG/3ML) 0.083% IN NEBU
2.5000 mg | INHALATION_SOLUTION | Freq: Once | RESPIRATORY_TRACT | Status: AC
  Administered 2016-08-29: 2.5 mg via RESPIRATORY_TRACT

## 2017-04-03 ENCOUNTER — Ambulatory Visit (INDEPENDENT_AMBULATORY_CARE_PROVIDER_SITE_OTHER): Payer: Managed Care, Other (non HMO) | Admitting: Internal Medicine

## 2017-04-03 ENCOUNTER — Ambulatory Visit: Payer: Managed Care, Other (non HMO) | Admitting: Internal Medicine

## 2017-04-03 ENCOUNTER — Encounter: Payer: Self-pay | Admitting: Internal Medicine

## 2017-04-03 VITALS — BP 112/80 | HR 80 | Temp 97.4°F | Ht 66.0 in | Wt 191.0 lb

## 2017-04-03 DIAGNOSIS — L509 Urticaria, unspecified: Secondary | ICD-10-CM

## 2017-04-03 DIAGNOSIS — T783XXD Angioneurotic edema, subsequent encounter: Secondary | ICD-10-CM | POA: Diagnosis not present

## 2017-04-03 DIAGNOSIS — T782XXD Anaphylactic shock, unspecified, subsequent encounter: Secondary | ICD-10-CM | POA: Diagnosis not present

## 2017-04-03 MED ORDER — RANITIDINE HCL 150 MG PO TABS: 150.0000 mg | ORAL_TABLET | Freq: Two times a day (BID) | ORAL | 1 refills | Status: DC

## 2017-04-03 MED ORDER — PREDNISONE 10 MG PO TABS: ORAL_TABLET | ORAL | 1 refills | Status: DC

## 2017-04-03 MED ORDER — TRIAMCINOLONE ACETONIDE 0.1 % EX CREA: 1.0000 "application " | TOPICAL_CREAM | Freq: Two times a day (BID) | CUTANEOUS | 0 refills | Status: DC

## 2017-04-03 MED ORDER — HYDROXYZINE HCL 25 MG PO TABS: ORAL_TABLET | ORAL | 1 refills | Status: DC

## 2017-04-03 NOTE — Patient Instructions (Signed)
Increase intake to 150 mg twice daily. Take Atarax instead of Benadryl as directed. Continue prednisone taper. See Dr. Neldon Mc, allergist for testing. Claritin 10 mg daily. Refill given on prednisone taper. Triamcinolone cream to use sparingly on hives.

## 2017-04-03 NOTE — Progress Notes (Signed)
   Subjective:    Patient ID: Cheryl Watson, female    DOB: 13-Nov-1971, 46 y.o.   MRN: [id number]  HPI  46 year old Female with history of urticaria 2-3 times in her life previously. On Saturday evening March 31, she had a meal and drank a pina collada. The next morning she awakened with hives. She took some Benadryl. She didn't think much about it. She called the office on Monday morning. I was out of town. She continued to take Benadryl. On Tuesday morning, she awakened with lip swelling. She tried to take some Benadryl by mouth and it would not go down well. Seemed to get stuck in her throat. She realized she was having a significant allergic reaction. She drove herself to an urgent care center where she was given epinephrine. She was placed on a prednisone dosepak going from 60 mg to 0 mg over 6 days. She was told to take Zantac 150 mg once a day. She has had some recurrence this morning of hives on her right neck and right lateral trunk area. She was given a prescription for an EpiPen but it needs prior authorization.    Review of Systems     Objective:   Physical Exam Her lips are currently not swollen. I do not see any evidence of urticaria at this point in time.       Assessment & Plan:  Urticaria  Angioedema  Anaphylaxis  Plan: Patient is agreeable to seeing allergist regarding testing. She was given a refill on Sterapred DS 10 mg 6 day dosepak. Gave her Atarax instead of Benadryl and she may take 25-50 mg of Atarax 3 times daily. Take Zantac 150 mg twice daily. Start Claritin 10 mg daily. Triamcinolone cream sparingly for hives.

## 2017-05-02 ENCOUNTER — Other Ambulatory Visit: Payer: Self-pay

## 2017-05-02 MED ORDER — RANITIDINE HCL 150 MG PO TABS: 150.0000 mg | ORAL_TABLET | Freq: Two times a day (BID) | ORAL | 99 refills | Status: DC

## 2017-05-07 ENCOUNTER — Ambulatory Visit: Payer: Managed Care, Other (non HMO) | Admitting: Allergy and Immunology

## 2017-05-14 ENCOUNTER — Encounter: Payer: Self-pay | Admitting: Gynecology

## 2017-05-27 ENCOUNTER — Other Ambulatory Visit: Payer: Self-pay | Admitting: Internal Medicine

## 2017-05-27 ENCOUNTER — Other Ambulatory Visit: Payer: Managed Care, Other (non HMO) | Admitting: Internal Medicine

## 2017-05-27 DIAGNOSIS — R7989 Other specified abnormal findings of blood chemistry: Secondary | ICD-10-CM

## 2017-05-27 DIAGNOSIS — Z13220 Encounter for screening for lipoid disorders: Secondary | ICD-10-CM

## 2017-05-27 DIAGNOSIS — Z130 Encounter for screening for diseases of the blood and blood-forming organs and certain disorders involving the immune mechanism: Secondary | ICD-10-CM

## 2017-05-27 DIAGNOSIS — Z0000 Encounter for general adult medical examination without abnormal findings: Secondary | ICD-10-CM

## 2017-05-27 DIAGNOSIS — E559 Vitamin D deficiency, unspecified: Secondary | ICD-10-CM

## 2017-05-27 DIAGNOSIS — Z1329 Encounter for screening for other suspected endocrine disorder: Secondary | ICD-10-CM

## 2017-05-27 DIAGNOSIS — Z87891 Personal history of nicotine dependence: Secondary | ICD-10-CM

## 2017-05-27 DIAGNOSIS — R945 Abnormal results of liver function studies: Secondary | ICD-10-CM

## 2017-05-27 LAB — COMPLETE METABOLIC PANEL WITH GFR
ALT: 18 U/L (ref 6–29)
AST: 17 U/L (ref 10–35)
Albumin: 3.8 g/dL (ref 3.6–5.1)
Alkaline Phosphatase: 52 U/L (ref 33–115)
BUN: 10 mg/dL (ref 7–25)
CALCIUM: 9.1 mg/dL (ref 8.6–10.2)
CHLORIDE: 107 mmol/L (ref 98–110)
CO2: 23 mmol/L (ref 20–31)
Creat: 0.64 mg/dL (ref 0.50–1.10)
GFR, Est African American: >89 mL/min (ref >=60)
GLUCOSE: 116 mg/dL — AB (ref 65–99)
POTASSIUM: 4.7 mmol/L (ref 3.5–5.3)
SODIUM: 139 mmol/L (ref 135–146)
Total Bilirubin: 0.4 mg/dL (ref 0.2–1.2)
Total Protein: 6.1 g/dL (ref 6.1–8.1)

## 2017-05-27 LAB — CBC
HCT: 38.1 % (ref 35.0–45.0)
Hemoglobin: 12.3 g/dL (ref 11.7–15.5)
MCH: 29.6 pg (ref 27.0–33.0)
MCHC: 32.3 g/dL (ref 32.0–36.0)
MCV: 91.8 fL (ref 80.0–100.0)
MPV: 10.5 fL (ref 7.5–12.5)
Platelets: 289 10*3/uL (ref 140–400)
RBC: 4.15 MIL/uL (ref 3.80–5.10)
RDW: 13.6 % (ref 11.0–15.0)
WBC: 7.4 10*3/uL (ref 3.8–10.8)

## 2017-05-27 LAB — LIPID PANEL
CHOL/HDL RATIO: 3.6 ratio (ref <5.0)
CHOLESTEROL: 185 mg/dL (ref <200)
HDL: 52 mg/dL (ref >50)
LDL Cholesterol: 108 mg/dL — ABNORMAL HIGH (ref <100)
Triglycerides: 125 mg/dL (ref <150)
VLDL: 25 mg/dL (ref <30)

## 2017-05-27 LAB — TSH: TSH: 0.94 mIU/L

## 2017-05-28 LAB — VITAMIN D 25 HYDROXY (VIT D DEFICIENCY, FRACTURES): VIT D 25 HYDROXY: 23 ng/mL — AB (ref 30–100)

## 2017-05-30 ENCOUNTER — Encounter: Payer: Self-pay | Admitting: Internal Medicine

## 2017-05-30 ENCOUNTER — Other Ambulatory Visit (HOSPITAL_COMMUNITY)
Admission: RE | Admit: 2017-05-30 | Discharge: 2017-05-30 | Disposition: A | Discharge status: Home / Self Care | Payer: Managed Care, Other (non HMO) | Source: Ambulatory Visit | Attending: Internal Medicine | Admitting: Internal Medicine

## 2017-05-30 ENCOUNTER — Ambulatory Visit (INDEPENDENT_AMBULATORY_CARE_PROVIDER_SITE_OTHER): Payer: Managed Care, Other (non HMO) | Admitting: Internal Medicine

## 2017-05-30 VITALS — BP 104/68 | HR 97 | Temp 98.4°F | Ht 66.0 in | Wt 197.0 lb

## 2017-05-30 DIAGNOSIS — Z87891 Personal history of nicotine dependence: Secondary | ICD-10-CM | POA: Diagnosis not present

## 2017-05-30 DIAGNOSIS — R635 Abnormal weight gain: Secondary | ICD-10-CM | POA: Diagnosis not present

## 2017-05-30 DIAGNOSIS — Z0000 Encounter for general adult medical examination without abnormal findings: Secondary | ICD-10-CM | POA: Diagnosis not present

## 2017-05-30 DIAGNOSIS — Z1151 Encounter for screening for human papillomavirus (HPV): Secondary | ICD-10-CM

## 2017-05-30 DIAGNOSIS — Z720 Tobacco use: Secondary | ICD-10-CM

## 2017-05-30 DIAGNOSIS — Z01419 Encounter for gynecological examination (general) (routine) without abnormal findings: Secondary | ICD-10-CM | POA: Diagnosis not present

## 2017-05-30 DIAGNOSIS — Z716 Tobacco abuse counseling: Secondary | ICD-10-CM | POA: Diagnosis not present

## 2017-05-30 DIAGNOSIS — E559 Vitamin D deficiency, unspecified: Secondary | ICD-10-CM

## 2017-05-30 LAB — POCT URINALYSIS DIPSTICK
Bilirubin, UA: NEGATIVE
Blood, UA: NEGATIVE
Glucose, UA: NEGATIVE
Ketones, UA: NEGATIVE
Leukocytes, UA: NEGATIVE
Nitrite, UA: NEGATIVE
Protein, UA: NEGATIVE
Spec Grav, UA: 1.015
Urobilinogen, UA: 0.2 U/dL
pH, UA: 6

## 2017-05-30 MED ORDER — NALTREXONE-BUPROPION HCL ER 8-90 MG PO TB12: 1.0000 | ORAL_TABLET | Freq: Once | ORAL | 0 refills | Status: DC

## 2017-05-30 NOTE — Progress Notes (Signed)
Subjective:    Patient ID: Cheryl Watson, female    DOB: 08/12/1971, 46 y.o.   MRN: [id number]  HPI 46 year old female in today for health maintenance exam and evaluation of medical issues. She has an elevated fasting serum glucose. Hemoglobin A1c was added improved be within normal limits. She has issues with weight gain. She has gained about 20 pounds in the past year. She is asking about Victoza for weight loss. Her friend takes it. I have suggested we try Contrave instead if we can get it approved. She has tried Weight Watchers in the past without much luck. Never been on diet pills. Does light exercise.  She is allergic to penicillin and ampicillin.  Past medical history: Cholecystectomy 2001.  Takes aspirin 81 mg daily.  Had tetanus immunization 2013 at [facility].  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 She is a widow. She has smoked a pack of cigarettes daily for some 30 years. Rarely drinks alcohol. Tries to get some light exercise. Husband died of cirrhosis of the liver. She has 3 children a daughter age 24, a son age 74 and another son age 73. Resides with Anastasio Champion her significant other who is the brother of Valeda Malm who is also a patient here.  Recently had issues with urticaria and significant angioedema. Was prescribed an EpiPen. Was suggested that she see allergist but has not gotten appointment yet. See note 04/03/2017.  Family history: Father age 47 disable with severe peripheral neuropathy and brain atrophy related to alcohol abuse. Mother 77 years old but pt is estranged from her. No siblings.    Review of Systems  Constitutional: Positive for fatigue.  Respiratory: Negative.   Cardiovascular: Negative.   Gastrointestinal: Negative.   Genitourinary: Negative.   Allergic/Immunologic:       History of urticaria and angioedema  Neurological: Negative.     Psychiatric/Behavioral: Negative.        Objective:   Physical Exam  Constitutional: She is oriented to person, place, and time. She appears well-developed and well-nourished. No distress.  HENT:  Head: Normocephalic and atraumatic.  Right Ear: External ear normal.  Left Ear: External ear normal.  Mouth/Throat: Oropharynx is clear and moist. No oropharyngeal exudate.  Eyes: Conjunctivae and EOM are normal. Pupils are equal, round, and reactive to light. Right eye exhibits no discharge. Left eye exhibits no discharge.  Neck: Neck supple. No JVD present. No thyromegaly present.  Cardiovascular: Normal rate, regular rhythm, normal heart sounds and intact distal pulses.   No murmur heard. Pulmonary/Chest: No respiratory distress. She has no wheezes. She has no rales.  Breasts normal female without masses  Abdominal: Soft. Bowel sounds are normal. She exhibits no distension and no mass. There is no tenderness. There is no rebound and no guarding.  Genitourinary:  Genitourinary Comments: Pap taken- bimanual normal  Musculoskeletal: She exhibits no edema.  Lymphadenopathy:    She has no cervical adenopathy.  Neurological: She is alert and oriented to person, place, and time. She has normal reflexes.  Skin: Skin is warm and dry. No rash noted. She is not diaphoretic.  Psychiatric: She has a normal mood and affect. Her behavior is normal. Judgment and thought content normal.  Vitals reviewed.         Assessment & Plan:  History of smoking  20 pound weight gain with normal thyroid functions  Persistent vitamin D deficiency-recommend 2000  units vitamin D 3 daily  Elevated fasting serum glucose of 116 with normal hemoglobin A1c-recommend diet and exercise  Plan: Trial of Contrave reevaluate in 4 weeks

## 2017-05-31 LAB — HEMOGLOBIN A1C
HEMOGLOBIN A1C: 5.2 % (ref <5.7)
MEAN PLASMA GLUCOSE: 103 mg/dL

## 2017-06-01 NOTE — Patient Instructions (Signed)
Trial of Contrave and follow-up in 4 weeks.Take 2000 units vitamin D 3 daily for vitamin D deficiency. Recommend diet and exercise.

## 2017-06-03 LAB — CYTOLOGY - PAP: Diagnosis: NEGATIVE

## 2017-06-26 ENCOUNTER — Ambulatory Visit: Payer: Managed Care, Other (non HMO) | Admitting: Internal Medicine

## 2017-07-01 ENCOUNTER — Other Ambulatory Visit: Payer: Self-pay | Admitting: Internal Medicine

## 2017-09-08 ENCOUNTER — Other Ambulatory Visit: Payer: Self-pay | Admitting: Internal Medicine

## 2017-09-08 DIAGNOSIS — Z1231 Encounter for screening mammogram for malignant neoplasm of breast: Secondary | ICD-10-CM

## 2017-09-09 ENCOUNTER — Ambulatory Visit
Admission: RE | Admit: 2017-09-09 | Discharge: 2017-09-09 | Disposition: A | Discharge status: Home / Self Care | Payer: Managed Care, Other (non HMO) | Source: Ambulatory Visit | Attending: Internal Medicine | Admitting: Internal Medicine

## 2017-09-09 DIAGNOSIS — Z1231 Encounter for screening mammogram for malignant neoplasm of breast: Secondary | ICD-10-CM

## 2017-09-19 ENCOUNTER — Encounter: Payer: Self-pay | Admitting: Internal Medicine

## 2017-09-19 ENCOUNTER — Ambulatory Visit (INDEPENDENT_AMBULATORY_CARE_PROVIDER_SITE_OTHER): Payer: Managed Care, Other (non HMO) | Admitting: Internal Medicine

## 2017-09-19 VITALS — BP 102/68 | HR 81 | Temp 98.7°F | Wt 199.0 lb

## 2017-09-19 DIAGNOSIS — R079 Chest pain, unspecified: Secondary | ICD-10-CM

## 2017-09-19 DIAGNOSIS — J9801 Acute bronchospasm: Secondary | ICD-10-CM | POA: Diagnosis not present

## 2017-09-19 DIAGNOSIS — F439 Reaction to severe stress, unspecified: Secondary | ICD-10-CM | POA: Diagnosis not present

## 2017-09-19 DIAGNOSIS — Z87891 Personal history of nicotine dependence: Secondary | ICD-10-CM

## 2017-09-19 DIAGNOSIS — J069 Acute upper respiratory infection, unspecified: Secondary | ICD-10-CM | POA: Diagnosis not present

## 2017-09-19 MED ORDER — ALBUTEROL SULFATE HFA 108 (90 BASE) MCG/ACT IN AERS: 2.0000 | INHALATION_SPRAY | Freq: Four times a day (QID) | RESPIRATORY_TRACT | 0 refills | Status: DC | PRN

## 2017-09-19 MED ORDER — AZITHROMYCIN 250 MG PO TABS: ORAL_TABLET | ORAL | 1 refills | Status: DC

## 2017-09-19 NOTE — Progress Notes (Signed)
   Subjective:    Patient ID: Cheryl Watson, female    DOB: 11-20-1971, 46 y.o.   MRN: [id number]  HPI 46 year old Female had onset earlier this week of URI type symptoms with cough and congestion. Some intermittent wheezing and shortness of breath.  Also has been having some substernal chest pain recently. She is concerned because apparently father has had some heart issues and is been diagnosed with a low ejection fraction although he has a history of alcohol abuse.  EKG today shows no acute changes.  History of smoking a pack cigarettes daily for some 30 years.  Patient has situational stress with fiance who has been diagnosed with neurosarcoidosis and is under treatment at [facility].  He became ill in May and it has been a trying time. He is out of work. He is on high-dose prednisone and it makes him irritable at times.  Social history: Husband died of cirrhosis of the liver. She has 3 children a daughter age 75, sons age 35 and another son age 7. Lives with fiance who has recently been diagnosed with neurosarcoidosis and is on prednisone and Imuran at the present with plans to put him on Remicade and prednisone in the near future. Patient currently employed by [employer] as a Marine scientist.  Family history: Father age 46 with  peripheral neuropathy and brain atrophy related to alcohol abuse. She is estranged from her mother and knows nothing about her history. No siblings.  Review of Systems see above     Objective:   Physical Exam Skin warm and dry. Nodes none. TMs and pharynx are clear. Neck is supple. Chest clear to auscultation without rales or wheezing. Cardiac exam regular rate and rhythm normal S1 and S2. Extremities without edema.       Assessment & Plan:  Acute URI  Mild bronchospasm  Situational stress  Chest pain-etiology unclear but has EKG showing no acute changes  History of smoking a pack of cigarettes daily for some 30 years  Plan: Zithromax Z-PAK take  2 tabs day 1 followed by 1 tab days 2 through 5. Out of work for 3 days. Note provided. Albuterol inhaler 2 sprays by mouth 4 times a day when necessary wheezing. Rest and drink plenty of fluids. Cardiology evaluation regarding chest pain. Last chest x-ray was April 2017.

## 2017-09-19 NOTE — Patient Instructions (Addendum)
Zithromax Z-PAK take 2 tablets day 1 followed by 1 tablet days 2 through 5. Rest and drink plenty of fluids. Out of work for 3 days. Note provided. Cardiology consultation to be obtained. Use albuterol inhaler as needed for wheezing.

## 2017-09-22 ENCOUNTER — Telehealth: Payer: Self-pay

## 2017-09-22 MED ORDER — ALPRAZOLAM 0.5 MG PO TABS: ORAL_TABLET | ORAL | 0 refills | Status: DC

## 2017-09-22 NOTE — Telephone Encounter (Signed)
Phoned in medication. Pt is aware

## 2017-09-22 NOTE — Telephone Encounter (Signed)
Pt called stating Xanax was supposed to be sent in for her, please advise

## 2017-09-22 NOTE — Telephone Encounter (Signed)
Call in Xanax 0.5 mg #60 one half to one po bid prn anxiety with no refill

## 2017-09-27 ENCOUNTER — Other Ambulatory Visit: Payer: Self-pay | Admitting: Internal Medicine

## 2017-10-23 ENCOUNTER — Ambulatory Visit: Payer: Managed Care, Other (non HMO) | Admitting: Cardiology

## 2017-10-23 ENCOUNTER — Ambulatory Visit: Payer: Self-pay | Admitting: Cardiology

## 2017-10-24 ENCOUNTER — Encounter: Payer: Self-pay | Admitting: Cardiology

## 2017-10-29 ENCOUNTER — Other Ambulatory Visit: Payer: Self-pay | Admitting: Internal Medicine

## 2017-10-29 NOTE — Telephone Encounter (Signed)
Is she taking this? How much weight has she lost?

## 2018-02-27 ENCOUNTER — Ambulatory Visit (INDEPENDENT_AMBULATORY_CARE_PROVIDER_SITE_OTHER): Payer: 59 | Admitting: Internal Medicine

## 2018-02-27 ENCOUNTER — Encounter: Payer: Self-pay | Admitting: Internal Medicine

## 2018-02-27 VITALS — BP 110/80 | HR 90 | Temp 98.4°F | Ht 66.0 in | Wt 203.8 lb

## 2018-02-27 DIAGNOSIS — R110 Nausea: Secondary | ICD-10-CM | POA: Diagnosis not present

## 2018-02-27 DIAGNOSIS — H9209 Otalgia, unspecified ear: Secondary | ICD-10-CM

## 2018-02-27 MED ORDER — ALPRAZOLAM 0.5 MG PO TABS: ORAL_TABLET | ORAL | 5 refills | Status: DC

## 2018-02-27 MED ORDER — FLUCONAZOLE 150 MG PO TABS: 150.0000 mg | ORAL_TABLET | Freq: Once | ORAL | 1 refills | Status: AC

## 2018-02-27 MED ORDER — ONDANSETRON HCL 4 MG PO TABS: 4.0000 mg | ORAL_TABLET | Freq: Three times a day (TID) | ORAL | 0 refills | Status: DC | PRN

## 2018-02-27 MED ORDER — DOXYCYCLINE HYCLATE 100 MG PO TABS: 100.0000 mg | ORAL_TABLET | Freq: Two times a day (BID) | ORAL | 0 refills | Status: DC

## 2018-02-27 NOTE — Progress Notes (Signed)
   Subjective:    Patient ID: Cheryl Watson, female    DOB: November 12, 1971, 47 y.o.   MRN: [id number]  HPI  Patient in today complaining of ear pain and nausea.  No documented fever or flulike symptoms.    Review of Systems see above     Objective:   Physical Exam TMs are not fiery red.  May be slightly full.  Pharynx is clear.  No exudate.  Neck is supple without adenopathy.  Chest clear to auscultation.       Assessment & Plan:  Otalgia  Serous otitis media  Nausea  Plan: Zofran tablets 1 p.o. every 8 hours as needed nausea.  Doxycycline 100 mg twice daily for 10 days

## 2018-03-16 ENCOUNTER — Encounter: Payer: Self-pay | Admitting: Internal Medicine

## 2018-03-16 ENCOUNTER — Ambulatory Visit (INDEPENDENT_AMBULATORY_CARE_PROVIDER_SITE_OTHER): Payer: 59 | Admitting: Internal Medicine

## 2018-03-16 VITALS — BP 110/80 | HR 92 | Temp 98.0°F | Ht 66.0 in | Wt 206.0 lb

## 2018-03-16 DIAGNOSIS — H6690 Otitis media, unspecified, unspecified ear: Secondary | ICD-10-CM

## 2018-03-16 DIAGNOSIS — H6592 Unspecified nonsuppurative otitis media, left ear: Secondary | ICD-10-CM | POA: Diagnosis not present

## 2018-03-16 MED ORDER — METHYLPREDNISOLONE ACETATE 80 MG/ML IJ SUSP
80.0000 mg | Freq: Once | INTRAMUSCULAR | Status: AC
  Administered 2018-03-16: 80 mg via INTRAMUSCULAR

## 2018-03-16 MED ORDER — LEVOFLOXACIN 500 MG PO TABS: 500.0000 mg | ORAL_TABLET | Freq: Every day | ORAL | 0 refills | Status: DC

## 2018-03-16 NOTE — Progress Notes (Signed)
   Subjective:    Patient ID: Cheryl Watson, female    DOB: July 26, 1971, 47 y.o.   MRN: [id number]  HPI 47 year old seen recently for ear pain on March 1 treated with doxycycline in today with complaint of left ear pain.  Minimal improvement with doxycycline.  No fever or chills or flulike symptoms.  No sore throat.  No swimming.    Review of Systems see above     Objective:   Physical Exam Minimal fluid behind left TM with splayed light reflex.  Right TM within normal limits.  Pharynx is clear.  Neck supple.  Chest clear.       Assessment & Plan:  Persistent left serous otitis media  Plan: Levaquin 500 mg daily for 10 days.  Depo-Medrol 80 mg IM.

## 2018-03-17 NOTE — Patient Instructions (Signed)
Levaquin 500 mg daily for 10 days.  Depo-Medrol 80 mg IM.

## 2018-03-17 NOTE — Patient Instructions (Signed)
Take doxycycline 100 mg twice daily for 10 days.  Zofran tablets every 8 hours as needed for nausea.

## 2018-06-29 ENCOUNTER — Ambulatory Visit: Payer: 59 | Admitting: Internal Medicine

## 2018-06-29 ENCOUNTER — Encounter: Payer: Self-pay | Admitting: Internal Medicine

## 2018-06-29 VITALS — BP 120/80 | HR 88 | Temp 98.2°F | Ht 66.0 in | Wt 202.0 lb

## 2018-06-29 DIAGNOSIS — D179 Benign lipomatous neoplasm, unspecified: Secondary | ICD-10-CM

## 2018-06-29 NOTE — Progress Notes (Signed)
   Subjective:    Patient ID: Cheryl Watson, female    DOB: 12-19-1971, 47 y.o.   MRN: [id number]  HPI Has noticed 3 nodules on her arms that she is concerned about.  These do not hurt.  One is on her right upper arm, another one is on her mid lower arm volar aspect and the other one is on her left upper arm.    Review of Systems     Objective:   Physical Exam  These are all small nodules that are not rockhard.  The largest is about a nickel in size.  They are mobile.      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  She will notify me if they start to enlarge or change in texture.

## 2018-06-29 NOTE — Patient Instructions (Signed)
Notify us if these nodules change in size or texture

## 2018-07-13 ENCOUNTER — Other Ambulatory Visit: Payer: Self-pay | Admitting: Internal Medicine

## 2018-07-13 DIAGNOSIS — E559 Vitamin D deficiency, unspecified: Secondary | ICD-10-CM

## 2018-07-13 DIAGNOSIS — Z1329 Encounter for screening for other suspected endocrine disorder: Secondary | ICD-10-CM

## 2018-07-13 DIAGNOSIS — Z0000 Encounter for general adult medical examination without abnormal findings: Secondary | ICD-10-CM

## 2018-07-13 DIAGNOSIS — E7800 Pure hypercholesterolemia, unspecified: Secondary | ICD-10-CM

## 2018-07-17 ENCOUNTER — Other Ambulatory Visit: Payer: 59 | Admitting: Internal Medicine

## 2018-07-17 DIAGNOSIS — E559 Vitamin D deficiency, unspecified: Secondary | ICD-10-CM | POA: Diagnosis not present

## 2018-07-17 DIAGNOSIS — Z1329 Encounter for screening for other suspected endocrine disorder: Secondary | ICD-10-CM

## 2018-07-17 DIAGNOSIS — Z0000 Encounter for general adult medical examination without abnormal findings: Secondary | ICD-10-CM | POA: Diagnosis not present

## 2018-07-17 DIAGNOSIS — E7800 Pure hypercholesterolemia, unspecified: Secondary | ICD-10-CM

## 2018-07-18 LAB — LIPID PANEL
CHOL/HDL RATIO: 3.6 (calc) (ref <5.0)
Cholesterol: 186 mg/dL (ref <200)
HDL: 51 mg/dL (ref >50)
LDL CHOLESTEROL (CALC): 114 mg/dL — AB
NON-HDL CHOLESTEROL (CALC): 135 mg/dL — AB (ref <130)
TRIGLYCERIDES: 103 mg/dL (ref <150)

## 2018-07-18 LAB — CBC WITH DIFFERENTIAL/PLATELET
BASOS PCT: 0.6 %
Basophils Absolute: 51 cells/uL (ref 0–200)
EOS PCT: 1.6 %
Eosinophils Absolute: 136 cells/uL (ref 15–500)
HCT: 37.7 % (ref 35.0–45.0)
HEMOGLOBIN: 12.6 g/dL (ref 11.7–15.5)
Lymphs Abs: 2193 cells/uL (ref 850–3900)
MCH: 30.2 pg (ref 27.0–33.0)
MCHC: 33.4 g/dL (ref 32.0–36.0)
MCV: 90.4 fL (ref 80.0–100.0)
MONOS PCT: 8.2 %
MPV: 11.8 fL (ref 7.5–12.5)
NEUTROS ABS: 5423 {cells}/uL (ref 1500–7800)
Neutrophils Relative %: 63.8 %
PLATELETS: 280 10*3/uL (ref 140–400)
RBC: 4.17 10*6/uL (ref 3.80–5.10)
RDW: 12.6 % (ref 11.0–15.0)
TOTAL LYMPHOCYTE: 25.8 %
WBC mixed population: 697 cells/uL (ref 200–950)
WBC: 8.5 10*3/uL (ref 3.8–10.8)

## 2018-07-18 LAB — COMPLETE METABOLIC PANEL WITH GFR
AG RATIO: 1.8 (calc) (ref 1.0–2.5)
ALKALINE PHOSPHATASE (APISO): 73 U/L (ref 33–115)
ALT: 13 U/L (ref 6–29)
AST: 14 U/L (ref 10–35)
Albumin: 4.2 g/dL (ref 3.6–5.1)
BILIRUBIN TOTAL: 0.5 mg/dL (ref 0.2–1.2)
BUN: 11 mg/dL (ref 7–25)
CALCIUM: 9.1 mg/dL (ref 8.6–10.2)
CHLORIDE: 108 mmol/L (ref 98–110)
CO2: 22 mmol/L (ref 20–32)
Creat: 0.65 mg/dL (ref 0.50–1.10)
GFR, Est African American: 122 mL/min/{1.73_m2} (ref >=60)
GFR, Est Non African American: 105 mL/min/{1.73_m2} (ref >=60)
Globulin: 2.3 g/dL (calc) (ref 1.9–3.7)
Glucose, Bld: 94 mg/dL (ref 65–99)
POTASSIUM: 4.4 mmol/L (ref 3.5–5.3)
Sodium: 139 mmol/L (ref 135–146)
Total Protein: 6.5 g/dL (ref 6.1–8.1)

## 2018-07-18 LAB — VITAMIN D 25 HYDROXY (VIT D DEFICIENCY, FRACTURES): Vit D, 25-Hydroxy: 27 ng/mL — ABNORMAL LOW (ref 30–100)

## 2018-07-18 LAB — TSH: TSH: 1.56 m[IU]/L

## 2018-07-21 ENCOUNTER — Ambulatory Visit (INDEPENDENT_AMBULATORY_CARE_PROVIDER_SITE_OTHER): Payer: 59 | Admitting: Internal Medicine

## 2018-07-21 ENCOUNTER — Encounter: Payer: Self-pay | Admitting: Internal Medicine

## 2018-07-21 VITALS — BP 102/70 | HR 80 | Ht 66.0 in | Wt 202.0 lb

## 2018-07-21 DIAGNOSIS — Z0000 Encounter for general adult medical examination without abnormal findings: Secondary | ICD-10-CM | POA: Diagnosis not present

## 2018-07-21 DIAGNOSIS — E559 Vitamin D deficiency, unspecified: Secondary | ICD-10-CM | POA: Diagnosis not present

## 2018-07-21 DIAGNOSIS — E669 Obesity, unspecified: Secondary | ICD-10-CM

## 2018-07-21 DIAGNOSIS — E7800 Pure hypercholesterolemia, unspecified: Secondary | ICD-10-CM | POA: Diagnosis not present

## 2018-07-21 LAB — POCT URINALYSIS DIPSTICK
APPEARANCE: NORMAL
Bilirubin, UA: NEGATIVE
Glucose, UA: NEGATIVE
Ketones, UA: NEGATIVE
LEUKOCYTES UA: NEGATIVE
Nitrite, UA: NEGATIVE
ODOR: NORMAL
PH UA: 6.5 (ref 5.0–8.0)
PROTEIN UA: NEGATIVE
RBC UA: NEGATIVE
Spec Grav, UA: 1.015 (ref 1.010–1.025)
Urobilinogen, UA: 0.2 E.U./dL

## 2018-07-21 MED ORDER — TOLTERODINE TARTRATE ER 4 MG PO CP24: 4.0000 mg | ORAL_CAPSULE | Freq: Every day | ORAL | 1 refills | Status: DC

## 2018-07-21 NOTE — Progress Notes (Signed)
   Subjective:    Patient ID: Cheryl Watson, female    DOB: December 12, 1971, 47 y.o.   MRN: [id number]  HPI  47 year old Female for health maintenance exam and evaluation of medical issues.  She is gained 5 pounds since last physical exam June 2018.  Suggested Dr. Migdalia Dk clinic for obesity.  She is allergic to penicillin and ampicillin.  Cholecystectomy 2001.  Had tetanus immunization at [place] in 2013.  Social history: She works as a Merchandiser, retail for [employer].  She has a four year college degree.  She is a widow.  She has smoked a pack of cigarettes daily for over 30 years.  She rarely drinks alcohol.  Not much time for exercise.  She has 3 children, a daughter age 53, a son age 83 and another son age 12.  Her husband died of cirrhosis of the liver.  She  recently ended a long-term relationship with a man.  History of urticaria and angioedema.  Has been prescribed an EpiPen.  Family history: Father age 12 disabled with peripheral neuropathy and brain atrophy related to alcohol abuse.  Mother age 31 but patient is estranged from her and has not heard from her longtime.  No siblings.        Review of Systems no new complaints     Objective:   Physical Exam  Constitutional: She is oriented to person, place, and time. She appears well-developed and well-nourished.  HENT:  Head: Normocephalic and atraumatic.  Right Ear: External ear normal.  Left Ear: External ear normal.  Mouth/Throat: Oropharynx is clear and moist. No oropharyngeal exudate.  Eyes: Pupils are equal, round, and reactive to light. Conjunctivae and EOM are normal. Right eye exhibits no discharge. Left eye exhibits no discharge.  Neck: Neck supple. No JVD present. No tracheal deviation present. No thyromegaly present.  Cardiovascular: Normal rate, regular rhythm, normal heart sounds and intact distal pulses. Exam reveals no friction rub.  No murmur heard. Pulmonary/Chest: Effort  normal and breath sounds normal. No stridor. No respiratory distress. She has no wheezes. She has no rales.  Abdominal: Soft. Bowel sounds are normal. She exhibits no distension and no mass. There is no tenderness. There is no rebound and no guarding.  Genitourinary:  Genitourinary Comments: Pap taken in 2018.  Bimanual is normal today.  Musculoskeletal: She exhibits no edema or deformity.  Lymphadenopathy:    She has no cervical adenopathy.  Neurological: She is alert and oriented to person, place, and time. She displays normal reflexes. No cranial nerve deficit. Coordination normal.  Skin: Skin is warm and dry.  Psychiatric: She has a normal mood and affect. Her behavior is normal. Judgment and thought content normal.  Vitals reviewed.         Assessment & Plan:  Normal health maintenance exam  Vitamin D deficiency-vitamin D level was 27  Obesity class I-BMI 32  Mild elevation LDL at 114  Plan: Recommend Dr. Migdalia Dk obesity clinic.  Take 2000 units vitamin D3 daily.  Watch diet and try to get some exercise.  Return in 1 year or as needed.  Recommend annual mammogram.

## 2018-07-22 NOTE — Patient Instructions (Addendum)
Recommend Dr. Migdalia Dk clinic for weight loss.  Take 2000 units vitamin D3 daily.  Return in 1 year or as needed.  Work on diet exercise and weight loss.

## 2018-09-02 ENCOUNTER — Encounter (INDEPENDENT_AMBULATORY_CARE_PROVIDER_SITE_OTHER): Payer: 59

## 2018-09-14 ENCOUNTER — Encounter (INDEPENDENT_AMBULATORY_CARE_PROVIDER_SITE_OTHER): Payer: Self-pay | Admitting: Family Medicine

## 2018-09-14 ENCOUNTER — Ambulatory Visit (INDEPENDENT_AMBULATORY_CARE_PROVIDER_SITE_OTHER): Payer: 59 | Admitting: Family Medicine

## 2018-09-14 VITALS — BP 106/73 | HR 80 | Temp 98.0°F | Ht 66.0 in | Wt 195.0 lb

## 2018-09-14 DIAGNOSIS — Z9189 Other specified personal risk factors, not elsewhere classified: Secondary | ICD-10-CM

## 2018-09-14 DIAGNOSIS — Z6831 Body mass index (BMI) 31.0-31.9, adult: Secondary | ICD-10-CM | POA: Diagnosis not present

## 2018-09-14 DIAGNOSIS — E669 Obesity, unspecified: Secondary | ICD-10-CM

## 2018-09-14 DIAGNOSIS — R5383 Other fatigue: Secondary | ICD-10-CM | POA: Diagnosis not present

## 2018-09-14 DIAGNOSIS — E785 Hyperlipidemia, unspecified: Secondary | ICD-10-CM

## 2018-09-14 DIAGNOSIS — Z1331 Encounter for screening for depression: Secondary | ICD-10-CM

## 2018-09-14 DIAGNOSIS — R0602 Shortness of breath: Secondary | ICD-10-CM

## 2018-09-14 DIAGNOSIS — Z0289 Encounter for other administrative examinations: Secondary | ICD-10-CM

## 2018-09-15 LAB — FOLATE: Folate: 8.3 ng/mL (ref >3.0)

## 2018-09-15 LAB — HEMOGLOBIN A1C
Est. average glucose Bld gHb Est-mCnc: 111 mg/dL
Hgb A1c MFr Bld: 5.5 % (ref 4.8–5.6)

## 2018-09-15 LAB — TSH: TSH: 1.14 u[IU]/mL (ref 0.450–4.500)

## 2018-09-15 LAB — T4, FREE: FREE T4: 1.08 ng/dL (ref 0.82–1.77)

## 2018-09-15 LAB — INSULIN, RANDOM: INSULIN: 8.1 u[IU]/mL (ref 2.6–24.9)

## 2018-09-15 LAB — VITAMIN B12: Vitamin B-12: 511 pg/mL (ref 232–1245)

## 2018-09-15 LAB — T3: T3, Total: 144 ng/dL (ref 71–180)

## 2018-09-15 NOTE — Progress Notes (Signed)
.  Office: [phone]  /  Fax: [phone]   HPI:   Chief Complaint: OBESITY  Cheryl Watson (MR# [id number]) is a 47 y.o. female who presents on 09/14/2018 for obesity evaluation and treatment. Current BMI is Body mass index is 31.47 kg/m.Marland Kitchen Cheryl Watson has struggled with obesity for years and has been unsuccessful in either losing weight or maintaining long term weight loss. Cheryl Watson attended our information session and states she is currently in the action stage of change and ready to dedicate time achieving and maintaining a healthier weight.  Cheryl Watson was told by one of our patients about our clinic.   Cheryl Watson states her family eats meals together she thinks her family will eat healthier with  her she struggles with family and or coworkers weight loss sabotage her desired weight loss is 45-50 lbs she started gaining weight after having children her heaviest weight ever was 204 lbs she is a picky eater and doesn't like to eat healthier foods  she snacks frequently in the evenings she skips meals frequently she is frequently drinking liquids with calories she frequently makes poor food choices she has problems with excessive hunger  she struggles with emotional eating    Fatigue Cheryl Watson feels her energy is lower than it should be. This has worsened with weight gain and has not worsened recently. Cheryl Watson admits to daytime somnolence and  admits to waking up still tired. Patient is at risk for obstructive sleep apnea. Cheryl Watson has a history of symptoms of daytime fatigue. Patient generally gets 6 hours of sleep per night, and states they generally have generally restful sleep. Snoring is present. Apneic episodes are not present. Epworth Sleepiness Score is 9.  Dyspnea on exertion Cheryl Watson notes increasing shortness of breath with exercising and seems to be worsening over time with weight gain. She notes getting out of breath sooner with activity than she used to. This has not  gotten worse recently. EKG-Normal sinus rhythm. Cheryl Watson denies orthopnea.  Hyperlipidemia Cheryl Watson has hyperlipidemia and has been trying to improve her cholesterol levels with intensive lifestyle modification including a low saturated fat diet, exercise and weight loss. Her LDL has been elevated for 2 years and she denies any chest pain, claudication or myalgias.  At risk for cardiovascular disease Cheryl Watson is at a higher than average risk for cardiovascular disease due to obesity and hyperlipidemia. She currently denies any chest pain.  Depression Screen Cheryl Watson Food and Mood (modified PHQ-9) score was  Depression screen PHQ 2/9 09/14/2018  Decreased Interest 3  Down, Depressed, Hopeless 1  PHQ - 2 Score 4  Altered sleeping 1  Tired, decreased energy 3  Change in appetite 1  Feeling bad or failure about yourself  1  Trouble concentrating 1  Moving slowly or fidgety/restless 1  Suicidal thoughts 0  PHQ-9 Score 12  Difficult doing work/chores Somewhat difficult    ALLERGIES: Allergies  Allergen Reactions  . Penicillins     All cilin drugs    MEDICATIONS: Current Outpatient Medications on File Prior to Visit  Medication Sig Dispense Refill  . Cholecalciferol (VITAMIN D) 2000 units CAPS Take 1 each by mouth daily.     No current facility-administered medications on file prior to visit.     PAST MEDICAL HISTORY: Past Medical History:  Diagnosis Date  . Back pain   . Constipation   . Joint pain   . Palpitation   . Shortness of breath   . Vitamin D deficiency     PAST SURGICAL HISTORY:  Past Surgical History:  Procedure Laterality Date  . CESAREAN SECTION    . CHOLECYSTECTOMY    . TUBAL LIGATION      SOCIAL HISTORY: Social History   Tobacco Use  . Smoking status: Former Research scientist (life sciences)  . Smokeless tobacco: Former Systems developer    Quit date: 06/07/2016  Substance Use Topics  . Alcohol use: No    Alcohol/week: 0.0 standard drinks  . Drug use: No    FAMILY  HISTORY: Family History  Problem Relation Age of Onset  . Depression Mother   . Obesity Mother   . Neuropathy Father   . Alcohol abuse Father   . High blood pressure Father   . High Cholesterol Father   . Depression Father   . Sleep apnea Father   . Drug abuse Father   . Breast cancer Paternal Aunt     ROS: Review of Systems  Constitutional: Positive for malaise/fatigue. Negative for weight loss.  HENT: Positive for tinnitus.        + Nasal stuffiness + Dentures + Hoarseness (occasional)  Eyes: Positive for blurred vision and double vision.       + Wear glasses or contacts  Respiratory: Positive for shortness of breath (with exertion).   Cardiovascular: Positive for palpitations. Negative for chest pain, orthopnea and claudication.  Gastrointestinal: Positive for constipation and diarrhea.  Musculoskeletal: Positive for back pain. Negative for myalgias.       + Muscle or joint pain  Skin:       + Dryness  Neurological: Positive for dizziness.  Psychiatric/Behavioral:       + Stress    PHYSICAL EXAM: Blood pressure 106/73, pulse 80, temperature 98 F (36.7 C), temperature source Oral, height 5\' 6"  (1.676 m), weight 195 lb (88.5 kg), last menstrual period 09/08/2018, SpO2 98 %. Body mass index is 31.47 kg/m. Physical Exam  Constitutional: She is oriented to person, place, and time. She appears well-developed and well-nourished.  HENT:  Head: Normocephalic and atraumatic.  Nose: Nose normal.  Eyes: EOM are normal. No scleral icterus.  Neck: Normal range of motion. Neck supple. No thyromegaly present.  Cardiovascular: Normal rate and regular rhythm.  Pulmonary/Chest: Effort normal. No respiratory distress.  Abdominal: Soft. There is no tenderness.  Musculoskeletal:  Range of Motion normal in all 4 extremities Trace edema noted in bilateral lower extremities  Neurological: She is alert and oriented to person, place, and time. Coordination normal.  Skin: Skin is warm  and dry.  Psychiatric: She has a normal mood and affect. Her behavior is normal.  Vitals reviewed.   RECENT LABS AND TESTS: BMET    Component Value Date/Time   NA 139 07/17/2018 0940   K 4.4 07/17/2018 0940   CL 108 07/17/2018 0940   CO2 22 07/17/2018 0940   GLUCOSE 94 07/17/2018 0940   BUN 11 07/17/2018 0940   CREATININE 0.65 07/17/2018 0940   CALCIUM 9.1 07/17/2018 0940   GFRNONAA 105 07/17/2018 0940   GFRAA 122 07/17/2018 0940   Lab Results  Component Value Date   HGBA1C 5.5 09/14/2018   Lab Results  Component Value Date   INSULIN 8.1 09/14/2018   CBC    Component Value Date/Time   WBC 8.5 07/17/2018 0940   RBC 4.17 07/17/2018 0940   HGB 12.6 07/17/2018 0940   HCT 37.7 07/17/2018 0940   PLT 280 07/17/2018 0940   MCV 90.4 07/17/2018 0940   MCH 30.2 07/17/2018 0940   MCHC 33.4 07/17/2018 0940   RDW  12.6 07/17/2018 0940   LYMPHSABS 2,193 07/17/2018 0940   MONOABS 639 04/01/2016 0859   EOSABS 136 07/17/2018 0940   BASOSABS 51 07/17/2018 0940   Iron/TIBC/Ferritin/ %Sat No results found for: IRON, TIBC, FERRITIN, IRONPCTSAT Lipid Panel     Component Value Date/Time   CHOL 186 07/17/2018 0940   TRIG 103 07/17/2018 0940   HDL 51 07/17/2018 0940   CHOLHDL 3.6 07/17/2018 0940   VLDL 25 05/27/2017 1035   LDLCALC 114 (H) 07/17/2018 0940   Hepatic Function Panel     Component Value Date/Time   PROT 6.5 07/17/2018 0940   ALBUMIN 3.8 05/27/2017 1035   AST 14 07/17/2018 0940   ALT 13 07/17/2018 0940   ALKPHOS 52 05/27/2017 1035   BILITOT 0.5 07/17/2018 0940      Component Value Date/Time   TSH 1.140 09/14/2018 1219   Vitamin D No recent labs  ECG  shows NSR with a rate of 89 BPM INDIRECT CALORIMETER done today shows a VO2 of 276 and a REE of 1925. Her calculated basal metabolic rate is 4818 thus her basal metabolic rate is better than expected.    ASSESSMENT AND PLAN: Other fatigue - Plan: EKG 12-Lead, Hemoglobin A1c, Insulin, random, Vitamin B12,  Folate, T3, T4, free, TSH  Shortness of breath on exertion  Hyperlipidemia, unspecified hyperlipidemia type  At risk for heart disease  Screening for depression  Class 1 obesity with serious comorbidity and body mass index (BMI) of 31.0 to 31.9 in adult, unspecified obesity type  PLAN:  Fatigue Cheryl Watson was informed that her fatigue may be related to obesity, depression or many other causes. Labs will be ordered, and in the meanwhile Cheryl Watson has agreed to work on diet, exercise and weight loss to help with fatigue. Proper sleep hygiene was discussed including the need for 7-8 hours of quality sleep each night. A sleep study was not ordered based on symptoms and Epworth score.  Dyspnea on exertion Cheryl Watson shortness of breath appears to be obesity related and exercise induced. She has agreed to work on weight loss and gradually increase exercise to treat her exercise induced shortness of breath. If Cheryl Watson follows our instructions and loses weight without improvement of her shortness of breath, we will plan to refer to pulmonology. We will monitor this condition regularly. Cheryl Watson agrees to this plan.  Hyperlipidemia Cheryl Watson was informed of the American Heart Association Guidelines emphasizing intensive lifestyle modifications as the first line treatment for hyperlipidemia. We discussed many lifestyle modifications today in depth, and Cheryl Watson will continue to work on decreasing saturated fats such as fatty red meat, butter and many fried foods. She will also increase vegetables and lean protein in her diet and continue to work on exercise and weight loss efforts. We will check labs today, and follow up in 3 months. Cheryl Watson agrees to follow up with our clinic in 2 weeks.  Cardiovascular risk counselling Cheryl Watson was given extended (15 minutes) coronary artery disease prevention counseling today. She is 47 y.o. female and has risk factors for heart disease including obesity and  hyperlipidemia. We discussed intensive lifestyle modifications today with an emphasis on specific weight loss instructions and strategies. Pt was also informed of the importance of increasing exercise and decreasing saturated fats to help prevent heart disease.  Depression Screen Cheryl Watson had a moderately positive depression screening. Depression is commonly associated with obesity and often results in emotional eating behaviors. We will monitor this closely and work on CBT to help improve the non-hunger eating  patterns. Referral to Psychology may be required if no improvement is seen as she continues in our clinic.  Obesity Cheryl Watson is currently in the action stage of change and her goal is to continue with weight loss efforts She has agreed to follow the Category 3 plan Cheryl Watson has been instructed to work up to a goal of 150 minutes of combined cardio and strengthening exercise per week for weight loss and overall health benefits. We discussed the following Behavioral Modification Strategies today: increasing lean protein intake, increasing vegetables, work on meal planning and easy cooking plans, and planning for success  Cheryl Watson has agreed to follow up with our clinic in 2 weeks. She was informed of the importance of frequent follow up visits to maximize her success with intensive lifestyle modifications for her multiple health conditions. She was informed we would discuss her lab results at her next visit unless there is a critical issue that needs to be addressed sooner. Cheryl Watson agreed to keep her next visit at the agreed upon time to discuss these results.    OBESITY BEHAVIORAL INTERVENTION VISIT  Today's visit was # 1   Starting weight: 195 lbs Starting date: 09/14/18 Today's weight :195 lbs Today's date: 09/14/2018 Total lbs lost to date: 0    ASK: We discussed the diagnosis of obesity with Cheryl Watson today and Cheryl Watson agreed to give [place] permission to discuss  obesity behavioral modification therapy today.  ASSESS: Gennavieve has the diagnosis of obesity and her BMI today is 31.49 Ashtynn is in the action stage of change   ADVISE: Etheline was educated on the multiple health risks of obesity as well as the benefit of weight loss to improve her health. She was advised of the need for long term treatment and the importance of lifestyle modifications to improve her current health and to decrease her risk of future health problems.  AGREE: Multiple dietary modification options and treatment options were discussed and  Tarina agreed to follow the recommendations documented in the above note.  ARRANGE: Moneka was educated on the importance of frequent visits to treat obesity as outlined per CMS and USPSTF guidelines and agreed to schedule her next follow up appointment today.   I, Trixie Dredge, am acting as transcriptionist for Ilene Qua, MD   I have reviewed the above documentation for accuracy and completeness, and I agree with the above. - Ilene Qua, MD

## 2018-09-29 ENCOUNTER — Ambulatory Visit (INDEPENDENT_AMBULATORY_CARE_PROVIDER_SITE_OTHER): Payer: 59 | Admitting: Family Medicine

## 2018-09-29 VITALS — BP 104/67 | HR 94 | Temp 98.3°F | Ht 66.0 in | Wt 194.0 lb

## 2018-09-29 DIAGNOSIS — E669 Obesity, unspecified: Secondary | ICD-10-CM

## 2018-09-29 DIAGNOSIS — E8881 Metabolic syndrome: Secondary | ICD-10-CM | POA: Diagnosis not present

## 2018-09-29 DIAGNOSIS — Z6831 Body mass index (BMI) 31.0-31.9, adult: Secondary | ICD-10-CM

## 2018-09-29 DIAGNOSIS — E559 Vitamin D deficiency, unspecified: Secondary | ICD-10-CM

## 2018-09-29 DIAGNOSIS — Z9189 Other specified personal risk factors, not elsewhere classified: Secondary | ICD-10-CM | POA: Diagnosis not present

## 2018-09-29 MED ORDER — VITAMIN D (ERGOCALCIFEROL) 1.25 MG (50000 UNIT) PO CAPS: 50000.0000 [IU] | ORAL_CAPSULE | ORAL | 0 refills | Status: DC

## 2018-09-30 NOTE — Progress Notes (Signed)
Office: [phone]  /  Fax: [phone]   HPI:   Chief Complaint: OBESITY Cheryl Watson is here to discuss her progress with her obesity treatment plan. She is on the Category 3 plan and is following her eating plan approximately 70-75 % of the time. She states she is exercising 0 minutes 0 times per week. Cheryl Watson did better the 2nd week than she did the 1 st week. She states her hunger is controlled. She was intermittently following the plan the 1st week, and more consistently getting food in in the 2nd week.  Her weight is 194 lb (88 kg) today and has had a weight loss of 1 pound over a period of 2 weeks since her last visit. She has lost 1 lb since starting treatment with [place].  Vitamin D Deficiency Cheryl Watson has a diagnosis of vitamin D deficiency. She is currently taking OTC Vit D. She notes fatigue and denies nausea, vomiting or muscle weakness.  Insulin Resistance Cheryl Watson has a diagnosis of insulin resistance based on her elevated fasting insulin level >5. Hgb A1c is of 5.5 and insulin of 8.1. She increased water weight and decreased in fat mass. Although Cheryl Watson's blood glucose readings are still under good control, insulin resistance puts her at greater risk of metabolic syndrome and diabetes. She is not taking metformin currently and continues to work on diet and exercise to decrease risk of diabetes.  At risk for diabetes Cheryl Watson is at higher than average risk for developing diabetes due to her obesity and insulin resistance. She currently denies polyuria or polydipsia.  ALLERGIES: Allergies  Allergen Reactions  . Penicillins     All cilin drugs    MEDICATIONS: Current Outpatient Medications on File Prior to Visit  Medication Sig Dispense Refill  . Cholecalciferol (VITAMIN D) 2000 units CAPS Take 1 each by mouth daily.     No current facility-administered medications on file prior to visit.     PAST MEDICAL HISTORY: Past Medical History:  Diagnosis Date  .  Back pain   . Constipation   . Joint pain   . Palpitation   . Shortness of breath   . Vitamin D deficiency     PAST SURGICAL HISTORY: Past Surgical History:  Procedure Laterality Date  . CESAREAN SECTION    . CHOLECYSTECTOMY    . TUBAL LIGATION      SOCIAL HISTORY: Social History   Tobacco Use  . Smoking status: Former Research scientist (life sciences)  . Smokeless tobacco: Former Systems developer    Quit date: 06/07/2016  Substance Use Topics  . Alcohol use: No    Alcohol/week: 0.0 standard drinks  . Drug use: No    FAMILY HISTORY: Family History  Problem Relation Age of Onset  . Depression Mother   . Obesity Mother   . Neuropathy Father   . Alcohol abuse Father   . High blood pressure Father   . High Cholesterol Father   . Depression Father   . Sleep apnea Father   . Drug abuse Father   . Breast cancer Paternal Aunt     ROS: Review of Systems  Constitutional: Positive for malaise/fatigue and weight loss.  Gastrointestinal: Negative for nausea and vomiting.  Genitourinary: Negative for frequency.  Musculoskeletal:       Negative muscle weakness  Endo/Heme/Allergies: Negative for polydipsia.    PHYSICAL EXAM: Blood pressure 104/67, pulse 94, temperature 98.3 F (36.8 C), temperature source Oral, height 5\' 6"  (1.676 m), weight 194 lb (88 kg), last menstrual period 09/08/2018, SpO2 99 %.  Body mass index is 31.31 kg/m. Physical Exam  Constitutional: She is oriented to person, place, and time. She appears well-developed and well-nourished.  Cardiovascular: Normal rate.  Pulmonary/Chest: Effort normal.  Musculoskeletal: Normal range of motion.  Neurological: She is oriented to person, place, and time.  Skin: Skin is warm and dry.  Psychiatric: She has a normal mood and affect. Her behavior is normal.  Vitals reviewed.   RECENT LABS AND TESTS: BMET    Component Value Date/Time   NA 139 07/17/2018 0940   K 4.4 07/17/2018 0940   CL 108 07/17/2018 0940   CO2 22 07/17/2018 0940   GLUCOSE  94 07/17/2018 0940   BUN 11 07/17/2018 0940   CREATININE 0.65 07/17/2018 0940   CALCIUM 9.1 07/17/2018 0940   GFRNONAA 105 07/17/2018 0940   GFRAA 122 07/17/2018 0940   Lab Results  Component Value Date   HGBA1C 5.5 09/14/2018   HGBA1C 5.2 05/27/2017   Lab Results  Component Value Date   INSULIN 8.1 09/14/2018   CBC    Component Value Date/Time   WBC 8.5 07/17/2018 0940   RBC 4.17 07/17/2018 0940   HGB 12.6 07/17/2018 0940   HCT 37.7 07/17/2018 0940   PLT 280 07/17/2018 0940   MCV 90.4 07/17/2018 0940   MCH 30.2 07/17/2018 0940   MCHC 33.4 07/17/2018 0940   RDW 12.6 07/17/2018 0940   LYMPHSABS 2,193 07/17/2018 0940   MONOABS 639 04/01/2016 0859   EOSABS 136 07/17/2018 0940   BASOSABS 51 07/17/2018 0940   Iron/TIBC/Ferritin/ %Sat No results found for: IRON, TIBC, FERRITIN, IRONPCTSAT Lipid Panel     Component Value Date/Time   CHOL 186 07/17/2018 0940   TRIG 103 07/17/2018 0940   HDL 51 07/17/2018 0940   CHOLHDL 3.6 07/17/2018 0940   VLDL 25 05/27/2017 1035   LDLCALC 114 (H) 07/17/2018 0940   Hepatic Function Panel     Component Value Date/Time   PROT 6.5 07/17/2018 0940   ALBUMIN 3.8 05/27/2017 1035   AST 14 07/17/2018 0940   ALT 13 07/17/2018 0940   ALKPHOS 52 05/27/2017 1035   BILITOT 0.5 07/17/2018 0940      Component Value Date/Time   TSH 1.140 09/14/2018 1219   TSH 1.56 07/17/2018 0940   TSH 0.94 05/27/2017 1035  Results for RING, Cheryl D "[name]" (MRN [id number]) as of 09/30/2018 11:21  Ref. Range 07/17/2018 09:40  Vitamin D, 25-Hydroxy Latest Ref Range: 30 - 100 ng/mL 27 (L)    ASSESSMENT AND PLAN: Vitamin D deficiency - Plan: Vitamin D, Ergocalciferol, (DRISDOL) 50000 units CAPS capsule  Insulin resistance  At risk for diabetes mellitus  Class 1 obesity with serious comorbidity and body mass index (BMI) of 31.0 to 31.9 in adult, unspecified obesity type  PLAN:  Vitamin D Deficiency [name] was informed that low vitamin D levels  contributes to fatigue and are associated with obesity, breast, and colon cancer. [name] agrees to start prescription Vit D @50 ,000 IU every week #4 with no refills. She will follow up for routine testing of vitamin D, at least 2-3 times per year. She was informed of the risk of over-replacement of vitamin D and agrees to not increase her dose unless she discusses this with [place] first. Zela agrees to follow up with our clinic in 2 weeks.  Insulin Resistance Anaid will continue to work on weight loss, exercise, and decreasing simple carbohydrates in her diet to help decrease the risk of diabetes. We dicussed metformin including benefits and  risks. She was informed that eating too many simple carbohydrates or too many calories at one sitting increases the likelihood of GI side effects. Kaisy declined metformin for now and prescription was not written today. We will recheck labs in 3 months. Madellyn agrees to follow up with our clinic in 2 weeks as directed to monitor her progress.  Diabetes risk counselling Sherre was given extended (30 minutes) diabetes prevention counseling today. She is 46 y.o. female and has risk factors for diabetes including obesity and insulin resistance. We discussed intensive lifestyle modifications today with an emphasis on weight loss as well as increasing exercise and decreasing simple carbohydrates in her diet.  Obesity Kiyana is currently in the action stage of change. As such, her goal is to continue with weight loss efforts She has agreed to follow the Category 3 plan Jashawna has been instructed to work up to a goal of 150 minutes of combined cardio and strengthening exercise per week for weight loss and overall health benefits. We discussed the following Behavioral Modification Strategies today: increasing lean protein intake, increasing vegetables, work on meal planning and easy cooking plans, and planning for success   Kitti has agreed to  follow up with our clinic in 2 weeks. She was informed of the importance of frequent follow up visits to maximize her success with intensive lifestyle modifications for her multiple health conditions.   OBESITY BEHAVIORAL INTERVENTION VISIT  Today's visit was # 2   Starting weight: 195 lbs Starting date: 09/14/18 Today's weight : 194 lbs  Today's date: 09/29/2018 Total lbs lost to date: 1    ASK: We discussed the diagnosis of obesity with Jacobo Forest today and Cherine agreed to give [place] permission to discuss obesity behavioral modification therapy today.  ASSESS: Adela has the diagnosis of obesity and her BMI today is 31.33 Marai is in the action stage of change   ADVISE: Braelynne was educated on the multiple health risks of obesity as well as the benefit of weight loss to improve her health. She was advised of the need for long term treatment and the importance of lifestyle modifications to improve her current health and to decrease her risk of future health problems.  AGREE: Multiple dietary modification options and treatment options were discussed and  Dawne agreed to follow the recommendations documented in the above note.  ARRANGE: Oaklie was educated on the importance of frequent visits to treat obesity as outlined per CMS and USPSTF guidelines and agreed to schedule her next follow up appointment today.  I, Trixie Dredge, am acting as transcriptionist for Ilene Qua, MD  I have reviewed the above documentation for accuracy and completeness, and I agree with the above. - Ilene Qua, MD

## 2018-10-13 ENCOUNTER — Ambulatory Visit (INDEPENDENT_AMBULATORY_CARE_PROVIDER_SITE_OTHER): Payer: 59 | Admitting: Family Medicine

## 2018-10-13 VITALS — BP 106/72 | HR 95 | Temp 98.0°F | Ht 66.0 in | Wt 192.0 lb

## 2018-10-13 DIAGNOSIS — Z6831 Body mass index (BMI) 31.0-31.9, adult: Secondary | ICD-10-CM | POA: Diagnosis not present

## 2018-10-13 DIAGNOSIS — Z9189 Other specified personal risk factors, not elsewhere classified: Secondary | ICD-10-CM

## 2018-10-13 DIAGNOSIS — E559 Vitamin D deficiency, unspecified: Secondary | ICD-10-CM | POA: Diagnosis not present

## 2018-10-13 DIAGNOSIS — E8881 Metabolic syndrome: Secondary | ICD-10-CM

## 2018-10-13 DIAGNOSIS — E669 Obesity, unspecified: Secondary | ICD-10-CM | POA: Diagnosis not present

## 2018-10-13 MED ORDER — VITAMIN D (ERGOCALCIFEROL) 1.25 MG (50000 UNIT) PO CAPS: 50000.0000 [IU] | ORAL_CAPSULE | ORAL | 0 refills | Status: DC

## 2018-10-14 NOTE — Progress Notes (Signed)
Office: [phone]  /  Fax: [phone]   HPI:   Chief Complaint: OBESITY Cheryl Watson is here to discuss her progress with her obesity treatment plan. She is on the Category 3 plan and is following her eating plan approximately 90 % of the time. She states she is exercising 0 minutes 0 times per week. Cheryl Watson is finding that it is easier to get into a routine and is figuring out what to eat. She has an occasional craving for a sweet. [place].  Her weight is 192 lb (87.1 kg) today and has had a weight loss of 2 pounds over a period of 2 weeks since her last visit. She has lost 3 lbs since starting treatment with [place].  Vitamin D deficiency Cheryl Watson has a diagnosis of vitamin D deficiency. She is currently taking vit D. She admits fatigue and denies nausea, vomiting or muscle weakness.  At risk for osteopenia and osteoporosis Cheryl Watson is at higher risk of osteopenia and osteoporosis due to vitamin D deficiency.   Insulin Resistance Cheryl Watson has a diagnosis of insulin resistance based on her elevated fasting insulin level >5. Although Cheryl Watson's blood glucose readings are still under good control, insulin resistance puts her at greater risk of metabolic syndrome and diabetes. She is having occasional cravings and denies hypoglycemia. She is not taking metformin currently and continues to work on diet and exercise to decrease risk of diabetes.  ALLERGIES: Allergies  Allergen Reactions  . Penicillins     All cilin drugs    MEDICATIONS: No current outpatient medications on file prior to visit.   No current facility-administered medications on file prior to visit.     PAST MEDICAL HISTORY: Past Medical History:  Diagnosis Date  . Back pain   . Constipation   . Joint pain   . Palpitation   . Shortness of breath   . Vitamin D deficiency     PAST SURGICAL HISTORY: Past Surgical History:  Procedure Laterality Date  . CESAREAN SECTION     . CHOLECYSTECTOMY    . TUBAL LIGATION      SOCIAL HISTORY: Social History   Tobacco Use  . Smoking status: Former Research scientist (life sciences)  . Smokeless tobacco: Former Systems developer    Quit date: 06/07/2016  Substance Use Topics  . Alcohol use: No    Alcohol/week: 0.0 standard drinks  . Drug use: No    FAMILY HISTORY: Family History  Problem Relation Age of Onset  . Depression Mother   . Obesity Mother   . Neuropathy Father   . Alcohol abuse Father   . High blood pressure Father   . High Cholesterol Father   . Depression Father   . Sleep apnea Father   . Drug abuse Father   . Breast cancer Paternal Aunt     ROS: Review of Systems  Constitutional: Positive for malaise/fatigue and weight loss.  Gastrointestinal: Positive for nausea and vomiting.  Musculoskeletal:       Negative for muscle weakness.  Endo/Heme/Allergies:       Negative for hypoglycemia. Positive for cravings.    PHYSICAL EXAM: Blood pressure 106/72, pulse 95, temperature 98 F (36.7 C), temperature source Oral, height 5\' 6"  (1.676 m), weight 192 lb (87.1 kg), SpO2 98 %. Body mass index is 30.99 kg/m. Physical Exam  Constitutional: She is oriented to person, place, and time. She appears well-developed and well-nourished.  Cardiovascular: Normal rate.  Pulmonary/Chest: Effort normal.  Musculoskeletal: Normal range of  motion.  Neurological: She is oriented to person, place, and time.  Skin: Skin is warm and dry.  Psychiatric: She has a normal mood and affect. Her behavior is normal.  Vitals reviewed.   RECENT LABS AND TESTS: BMET    Component Value Date/Time   NA 139 07/17/2018 0940   K 4.4 07/17/2018 0940   CL 108 07/17/2018 0940   CO2 22 07/17/2018 0940   GLUCOSE 94 07/17/2018 0940   BUN 11 07/17/2018 0940   CREATININE 0.65 07/17/2018 0940   CALCIUM 9.1 07/17/2018 0940   GFRNONAA 105 07/17/2018 0940   GFRAA 122 07/17/2018 0940   Lab Results  Component Value Date   HGBA1C 5.5 09/14/2018   HGBA1C 5.2  05/27/2017   Lab Results  Component Value Date   INSULIN 8.1 09/14/2018   CBC    Component Value Date/Time   WBC 8.5 07/17/2018 0940   RBC 4.17 07/17/2018 0940   HGB 12.6 07/17/2018 0940   HCT 37.7 07/17/2018 0940   PLT 280 07/17/2018 0940   MCV 90.4 07/17/2018 0940   MCH 30.2 07/17/2018 0940   MCHC 33.4 07/17/2018 0940   RDW 12.6 07/17/2018 0940   LYMPHSABS 2,193 07/17/2018 0940   MONOABS 639 04/01/2016 0859   EOSABS 136 07/17/2018 0940   BASOSABS 51 07/17/2018 0940   Iron/TIBC/Ferritin/ %Sat No results found for: IRON, TIBC, FERRITIN, IRONPCTSAT Lipid Panel     Component Value Date/Time   CHOL 186 07/17/2018 0940   TRIG 103 07/17/2018 0940   HDL 51 07/17/2018 0940   CHOLHDL 3.6 07/17/2018 0940   VLDL 25 05/27/2017 1035   LDLCALC 114 (H) 07/17/2018 0940   Hepatic Function Panel     Component Value Date/Time   PROT 6.5 07/17/2018 0940   ALBUMIN 3.8 05/27/2017 1035   AST 14 07/17/2018 0940   ALT 13 07/17/2018 0940   ALKPHOS 52 05/27/2017 1035   BILITOT 0.5 07/17/2018 0940      Component Value Date/Time   TSH 1.140 09/14/2018 1219   TSH 1.56 07/17/2018 0940   TSH 0.94 05/27/2017 1035   Results for RING, Cheryl D "[name]" (MRN [id number]) as of 10/14/2018 16:08  Ref. Range 07/17/2018 09:40  Vitamin D, 25-Hydroxy Latest Ref Range: 30 - 100 ng/mL 27 (L)   ASSESSMENT AND PLAN: Vitamin D deficiency - Plan: Vitamin D, Ergocalciferol, (DRISDOL) 50000 units CAPS capsule  Insulin resistance  At risk for osteoporosis  Class 1 obesity with serious comorbidity and body mass index (BMI) of 31.0 to 31.9 in adult, unspecified obesity type  PLAN:  Vitamin D Deficiency Cheryl Watson was informed that low vitamin D levels contributes to fatigue and are associated with obesity, breast, and colon cancer. She agrees to continue to take prescription Vit D @50 ,000 IU every week #4 with no refills and will follow up for routine testing of vitamin D, at least 2-3 times per year.  She was informed of the risk of over-replacement of vitamin D and agrees to not increase her dose unless she discusses this with [place] first. Cheryl Watson agreed to follow up in 2 weeks.  At risk for osteopenia and osteoporosis Cheryl Watson was given extended (15 minutes) osteoporosis prevention counseling today. Cheryl Watson is at risk for osteopenia and osteoporosis due to her vitamin D deficiency. She was encouraged to take her vitamin D and follow her higher calcium diet and increase strengthening exercise to help strengthen her bones and decrease her risk of osteopenia and osteoporosis.  Insulin Resistance Cheryl Watson will continue to work  on weight loss, exercise, and decreasing simple carbohydrates in her diet to help decrease the risk of diabetes. She was informed that eating too many simple carbohydrates or too many calories at one sitting increases the likelihood of GI side effects. We will repeat labs in late December. Cheryl Watson agreed to follow up with [place] as directed to monitor her progress.  Obesity Cheryl Watson is currently in the action stage of change. As such, her goal is to continue with weight loss efforts. She has agreed to follow the Category 3 plan. Cheryl Watson has been instructed to work up to a goal of 150 minutes of combined cardio and strengthening exercise per week for weight loss and overall health benefits. We discussed the following Behavioral Modification Strategies today: increasing lean protein intake, increasing vegetables, work on meal planning and easy cooking plans, and planning for success.  Cheryl Watson has agreed to follow up with our clinic in 2 weeks. She was informed of the importance of frequent follow up visits to maximize her success with intensive lifestyle modifications for her multiple health conditions.   OBESITY BEHAVIORAL INTERVENTION VISIT  Today's visit was # 3   Starting weight: 195 lbs Starting date: 09/14/18 Today's weight : Weight: 192 lb (87.1 kg)  Today's  date: 10/13/2018 Total lbs lost to date: 3  ASK We discussed the diagnosis of obesity with Cheryl Watson today and Cheryl Watson agreed to give [place] permission to discuss obesity behavioral modification therapy today.  ASSESS: Cheryl Watson has the diagnosis of obesity and her BMI today is 31.0. Cheryl Watson is in the action stage of change.   ADVISE: Cheryl Watson was educated on the multiple health risks of obesity as well as the benefit of weight loss to improve her health. She was advised of the need for long term treatment and the importance of lifestyle modifications to improve her current health and to decrease her risk of future health problems.  AGREE: Multiple dietary modification options and treatment options were discussed and Cheryl Watson agreed to follow the recommendations documented in the above note.  ARRANGE: Cheryl Watson was educated on the importance of frequent visits to treat obesity as outlined per CMS and USPSTF guidelines and agreed to schedule her next follow up appointment today.  I, Marcille Blanco, am acting as Location manager for Eber Jones, MD  I have reviewed the above documentation for accuracy and completeness, and I agree with the above. - Ilene Qua, MD

## 2018-10-27 ENCOUNTER — Ambulatory Visit (INDEPENDENT_AMBULATORY_CARE_PROVIDER_SITE_OTHER): Payer: 59 | Admitting: Bariatrics

## 2018-10-27 ENCOUNTER — Ambulatory Visit (INDEPENDENT_AMBULATORY_CARE_PROVIDER_SITE_OTHER): Payer: 59 | Admitting: Family Medicine

## 2018-10-27 ENCOUNTER — Encounter (INDEPENDENT_AMBULATORY_CARE_PROVIDER_SITE_OTHER): Payer: Self-pay | Admitting: Bariatrics

## 2018-10-27 VITALS — BP 95/68 | HR 80 | Temp 98.7°F | Ht 66.0 in | Wt 191.0 lb

## 2018-10-27 DIAGNOSIS — E669 Obesity, unspecified: Secondary | ICD-10-CM

## 2018-10-27 DIAGNOSIS — Z6830 Body mass index (BMI) 30.0-30.9, adult: Secondary | ICD-10-CM

## 2018-10-27 DIAGNOSIS — E559 Vitamin D deficiency, unspecified: Secondary | ICD-10-CM

## 2018-10-27 DIAGNOSIS — E8881 Metabolic syndrome: Secondary | ICD-10-CM | POA: Diagnosis not present

## 2018-10-28 NOTE — Progress Notes (Signed)
Office: [phone]  /  Fax: [phone]   HPI:   Chief Complaint: OBESITY Cheryl Watson is here to discuss her progress with her obesity treatment plan. She is on the  follow the Category 3 plan and is following her eating plan approximately 75 % of the time. She states she is exercising 0 minutes 0 times per week. Cheryl Watson struggled with life circumstances and she went to the ER three times over the last 2 weeks, she therefore has been unable to meal prep.   Her weight is 191 lb (86.6 kg) today and has had a weight loss of 1 pounds over a period of 2 weeks since her last visit. She has lost 4 lbs since starting treatment with [place].  Vitamin D deficiency Cheryl Watson has a diagnosis of vitamin D deficiency. She is currently taking vit D and denies nausea, vomiting or muscle weakness.  Insulin Resistance Cheryl Watson has a diagnosis of insulin resistance based on her elevated fasting insulin level >5. Although Cheryl Watson's blood glucose readings are still under good control, insulin resistance puts her at greater risk of metabolic syndrome and diabetes. She is not taking metformin currently and continues to work on diet and exercise to decrease risk of diabetes.   ALLERGIES: Allergies  Allergen Reactions  . Penicillins     All cilin drugs    MEDICATIONS: Current Outpatient Medications on File Prior to Visit  Medication Sig Dispense Refill  . Vitamin D, Ergocalciferol, (DRISDOL) 50000 units CAPS capsule Take 1 capsule (50,000 Units total) by mouth every 7 (seven) days. 4 capsule 0   No current facility-administered medications on file prior to visit.     PAST MEDICAL HISTORY: Past Medical History:  Diagnosis Date  . Back pain   . Constipation   . Joint pain   . Palpitation   . Shortness of breath   . Vitamin D deficiency     PAST SURGICAL HISTORY: Past Surgical History:  Procedure Laterality Date  . CESAREAN SECTION    . CHOLECYSTECTOMY    . TUBAL LIGATION      SOCIAL  HISTORY: Social History   Tobacco Use  . Smoking status: Former Research scientist (life sciences)  . Smokeless tobacco: Former Systems developer    Quit date: 06/07/2016  Substance Use Topics  . Alcohol use: No    Alcohol/week: 0.0 standard drinks  . Drug use: No    FAMILY HISTORY: Family History  Problem Relation Age of Onset  . Depression Mother   . Obesity Mother   . Neuropathy Father   . Alcohol abuse Father   . High blood pressure Father   . High Cholesterol Father   . Depression Father   . Sleep apnea Father   . Drug abuse Father   . Breast cancer Paternal Aunt     ROS: Review of Systems  All other systems reviewed and are negative.   PHYSICAL EXAM: Blood pressure 95/68, pulse 80, temperature 98.7 F (37.1 C), temperature source Oral, height 5\' 6"  (1.676 m), weight 191 lb (86.6 kg), SpO2 100 %. Body mass index is 30.83 kg/m. Physical Exam  Constitutional: She is oriented to person, place, and time. She appears well-developed and well-nourished.  HENT:  Head: Normocephalic.  Eyes: Pupils are equal, round, and reactive to light.  Neck: Normal range of motion.  Pulmonary/Chest: Effort normal.  Neurological: She is alert and oriented to person, place, and time.  Skin: Skin is warm and dry.  Psychiatric: She has a normal mood and affect. Her behavior is normal.  Vitals reviewed.   RECENT LABS AND TESTS: BMET    Component Value Date/Time   NA 139 07/17/2018 0940   K 4.4 07/17/2018 0940   CL 108 07/17/2018 0940   CO2 22 07/17/2018 0940   GLUCOSE 94 07/17/2018 0940   BUN 11 07/17/2018 0940   CREATININE 0.65 07/17/2018 0940   CALCIUM 9.1 07/17/2018 0940   GFRNONAA 105 07/17/2018 0940   GFRAA 122 07/17/2018 0940   Lab Results  Component Value Date   HGBA1C 5.5 09/14/2018   HGBA1C 5.2 05/27/2017   Lab Results  Component Value Date   INSULIN 8.1 09/14/2018   CBC    Component Value Date/Time   WBC 8.5 07/17/2018 0940   RBC 4.17 07/17/2018 0940   HGB 12.6 07/17/2018 0940   HCT 37.7  07/17/2018 0940   PLT 280 07/17/2018 0940   MCV 90.4 07/17/2018 0940   MCH 30.2 07/17/2018 0940   MCHC 33.4 07/17/2018 0940   RDW 12.6 07/17/2018 0940   LYMPHSABS 2,193 07/17/2018 0940   MONOABS 639 04/01/2016 0859   EOSABS 136 07/17/2018 0940   BASOSABS 51 07/17/2018 0940   Iron/TIBC/Ferritin/ %Sat No results found for: IRON, TIBC, FERRITIN, IRONPCTSAT Lipid Panel     Component Value Date/Time   CHOL 186 07/17/2018 0940   TRIG 103 07/17/2018 0940   HDL 51 07/17/2018 0940   CHOLHDL 3.6 07/17/2018 0940   VLDL 25 05/27/2017 1035   LDLCALC 114 (H) 07/17/2018 0940   Hepatic Function Panel     Component Value Date/Time   PROT 6.5 07/17/2018 0940   ALBUMIN 3.8 05/27/2017 1035   AST 14 07/17/2018 0940   ALT 13 07/17/2018 0940   ALKPHOS 52 05/27/2017 1035   BILITOT 0.5 07/17/2018 0940      Component Value Date/Time   TSH 1.140 09/14/2018 1219   TSH 1.56 07/17/2018 0940   TSH 0.94 05/27/2017 1035    ASSESSMENT AND PLAN: Vitamin D deficiency  Insulin resistance  Class 1 obesity without serious comorbidity with body mass index (BMI) of 30.0 to 30.9 in adult, unspecified obesity type  PLAN: Vitamin D Deficiency Cheryl Watson was informed that low vitamin D levels contributes to fatigue and are associated with obesity, breast, and colon cancer. She agrees to continue to take prescription Vit D @50 ,000 IU every week and will follow up for routine testing of vitamin D, at least 2-3 times per year. She was informed of the risk of over-replacement of vitamin D and agrees to not increase her dose unless she discusses this with [place] first.  Insulin Resistance Cheryl Watson will continue to work on weight loss, exercise, and decreasing simple carbohydrates in her diet to help decrease the risk of diabetes. We dicussed metformin including benefits and risks. She was informed that eating too many simple carbohydrates or too many calories at one sitting increases the likelihood of GI side effects.  Cheryl Watson declined metformin for now and prescription was not written today. Cheryl Watson agreed to follow up with [place] as directed to monitor her progress.  Obesity Cheryl Watson is currently in the action stage of change. As such, her goal is to continue with weight loss efforts She has agreed to follow the Category 3 plan Licet has been instructed to work up to a goal of 150 minutes of combined cardio and strengthening exercise per week for weight loss and overall health benefits. We discussed the following Behavioral Modification Stratagies today: increasing lean protein intake, decreasing simple carbohydrates , increasing vegetables, work on meal planning  and easy cooking plans and decrease liquid calories, increase her H20 intake and no skipping meals. She will work on [employer].    Kylina has agreed to follow up with our clinic in 2 weeks. She was informed of the importance of frequent follow up visits to maximize her success with intensive lifestyle modifications for her multiple health conditions.   OBESITY BEHAVIORAL INTERVENTION VISIT  Today's visit was # 4   Starting weight: 195 lbs Starting date: 09/24/18 Today's weight : Weight: 191 lb (86.6 kg)  Today's date: 10/27/18 Total lbs lost to date: 4   ASK: We discussed the diagnosis of obesity with Jacobo Forest today and Pernie agreed to give [place] permission to discuss obesity behavioral modification therapy today.  ASSESS: Malayiah has the diagnosis of obesity and her BMI today is 30.9 Shatisha is in the action stage of change   ADVISE: Taji was educated on the multiple health risks of obesity as well as the benefit of weight loss to improve her health. She was advised of the need for long term treatment and the importance of lifestyle modifications to improve her current health and to decrease her risk of future health problems.  AGREE: Multiple dietary modification options and treatment options were  discussed and  Jahmya agreed to follow the recommendations documented in the above note.  ARRANGE: Hiromi was educated on the importance of frequent visits to treat obesity as outlined per CMS and USPSTF guidelines and agreed to schedule her next follow up appointment today.  I, April Moore, am acting as transcriptionist for Dr Jearld Lesch.   I have reviewed the above documentation for accuracy and completeness, and I agree with the above. -Jearld Lesch, DO

## 2018-11-11 ENCOUNTER — Ambulatory Visit (INDEPENDENT_AMBULATORY_CARE_PROVIDER_SITE_OTHER): Payer: 59 | Admitting: Family Medicine

## 2018-11-11 VITALS — BP 122/78 | HR 93 | Temp 98.0°F | Ht 66.0 in | Wt 190.0 lb

## 2018-11-11 DIAGNOSIS — E669 Obesity, unspecified: Secondary | ICD-10-CM | POA: Diagnosis not present

## 2018-11-11 DIAGNOSIS — Z6830 Body mass index (BMI) 30.0-30.9, adult: Secondary | ICD-10-CM | POA: Diagnosis not present

## 2018-11-11 DIAGNOSIS — E559 Vitamin D deficiency, unspecified: Secondary | ICD-10-CM

## 2018-11-11 DIAGNOSIS — E785 Hyperlipidemia, unspecified: Secondary | ICD-10-CM

## 2018-11-12 NOTE — Progress Notes (Signed)
Office: [phone]  /  Fax: [phone]   HPI:   Chief Complaint: OBESITY Cheryl Watson is here to discuss her progress with her obesity treatment plan. She is on the Category 3 plan and is following her eating plan approximately 80 % of the time. She states she is exercising 0 minutes 0 times per week. Cheryl Watson feels she isn't getting all food in, especially snack wise and she is often getting in only 1 snack. Her hunger is controlled. Thanksgiving plan is to invite people to her house and she is cooking.  Her weight is 190 lb (86.2 kg) today and has had a weight loss of 1 pound over a period of 2 weeks since her last visit. She has lost 5 lbs since starting treatment with [place].  Vitamin D Deficiency Cheryl Watson has a diagnosis of vitamin D deficiency. She is currently taking prescription Vit D. She notes improved fatigue and denies nausea, vomiting or muscle weakness.  Hyperlipidemia Cheryl Watson has hyperlipidemia and has been trying to improve her cholesterol levels with intensive lifestyle modification including a low saturated fat diet, exercise and weight loss. Her LDL is elevated, she is not on statin and denies any chest pain, claudication or myalgias.  ALLERGIES: Allergies  Allergen Reactions  . Penicillins     All cilin drugs    MEDICATIONS: Current Outpatient Medications on File Prior to Visit  Medication Sig Dispense Refill  . Vitamin D, Ergocalciferol, (DRISDOL) 50000 units CAPS capsule Take 1 capsule (50,000 Units total) by mouth every 7 (seven) days. 4 capsule 0   No current facility-administered medications on file prior to visit.     PAST MEDICAL HISTORY: Past Medical History:  Diagnosis Date  . Back pain   . Constipation   . Joint pain   . Palpitation   . Shortness of breath   . Vitamin D deficiency     PAST SURGICAL HISTORY: Past Surgical History:  Procedure Laterality Date  . CESAREAN SECTION    . CHOLECYSTECTOMY    . TUBAL LIGATION      SOCIAL  HISTORY: Social History   Tobacco Use  . Smoking status: Former Research scientist (life sciences)  . Smokeless tobacco: Former Systems developer    Quit date: 06/07/2016  Substance Use Topics  . Alcohol use: No    Alcohol/week: 0.0 standard drinks  . Drug use: No    FAMILY HISTORY: Family History  Problem Relation Age of Onset  . Depression Mother   . Obesity Mother   . Neuropathy Father   . Alcohol abuse Father   . High blood pressure Father   . High Cholesterol Father   . Depression Father   . Sleep apnea Father   . Drug abuse Father   . Breast cancer Paternal Aunt     ROS: Review of Systems  Constitutional: Positive for malaise/fatigue and weight loss.  Cardiovascular: Negative for chest pain and claudication.  Gastrointestinal: Negative for nausea and vomiting.  Musculoskeletal: Negative for myalgias.       Negative muscle weakness    PHYSICAL EXAM: Blood pressure 122/78, pulse 93, temperature 98 F (36.7 C), temperature source Oral, height 5\' 6"  (1.676 m), weight 190 lb (86.2 kg), SpO2 98 %. Body mass index is 30.67 kg/m. Physical Exam  Constitutional: She is oriented to person, place, and time. She appears well-developed and well-nourished.  Cardiovascular: Normal rate.  Pulmonary/Chest: Effort normal.  Musculoskeletal: Normal range of motion.  Neurological: She is oriented to person, place, and time.  Skin: Skin is warm and  dry.  Psychiatric: She has a normal mood and affect. Her behavior is normal.  Vitals reviewed.   RECENT LABS AND TESTS: BMET    Component Value Date/Time   NA 139 07/17/2018 0940   K 4.4 07/17/2018 0940   CL 108 07/17/2018 0940   CO2 22 07/17/2018 0940   GLUCOSE 94 07/17/2018 0940   BUN 11 07/17/2018 0940   CREATININE 0.65 07/17/2018 0940   CALCIUM 9.1 07/17/2018 0940   GFRNONAA 105 07/17/2018 0940   GFRAA 122 07/17/2018 0940   Lab Results  Component Value Date   HGBA1C 5.5 09/14/2018   HGBA1C 5.2 05/27/2017   Lab Results  Component Value Date   INSULIN 8.1  09/14/2018   CBC    Component Value Date/Time   WBC 8.5 07/17/2018 0940   RBC 4.17 07/17/2018 0940   HGB 12.6 07/17/2018 0940   HCT 37.7 07/17/2018 0940   PLT 280 07/17/2018 0940   MCV 90.4 07/17/2018 0940   MCH 30.2 07/17/2018 0940   MCHC 33.4 07/17/2018 0940   RDW 12.6 07/17/2018 0940   LYMPHSABS 2,193 07/17/2018 0940   MONOABS 639 04/01/2016 0859   EOSABS 136 07/17/2018 0940   BASOSABS 51 07/17/2018 0940   Iron/TIBC/Ferritin/ %Sat No results found for: IRON, TIBC, FERRITIN, IRONPCTSAT Lipid Panel     Component Value Date/Time   CHOL 186 07/17/2018 0940   TRIG 103 07/17/2018 0940   HDL 51 07/17/2018 0940   CHOLHDL 3.6 07/17/2018 0940   VLDL 25 05/27/2017 1035   LDLCALC 114 (H) 07/17/2018 0940   Hepatic Function Panel     Component Value Date/Time   PROT 6.5 07/17/2018 0940   ALBUMIN 3.8 05/27/2017 1035   AST 14 07/17/2018 0940   ALT 13 07/17/2018 0940   ALKPHOS 52 05/27/2017 1035   BILITOT 0.5 07/17/2018 0940      Component Value Date/Time   TSH 1.140 09/14/2018 1219   TSH 1.56 07/17/2018 0940   TSH 0.94 05/27/2017 1035  Results for RING, Janisa D "[name]" (MRN [id number]) as of 11/12/2018 12:55  Ref. Range 07/17/2018 09:40  Vitamin D, 25-Hydroxy Latest Ref Range: 30 - 100 ng/mL 27 (L)    ASSESSMENT AND PLAN: Vitamin D deficiency  Hyperlipidemia, unspecified hyperlipidemia type  Class 1 obesity without serious comorbidity with body mass index (BMI) of 30.0 to 30.9 in adult, unspecified obesity type  PLAN:  Vitamin D Deficiency Cheryl Watson was informed that low vitamin D levels contributes to fatigue and are associated with obesity, breast, and colon cancer. Cheryl Watson agrees to continue taking prescription Vit D @50 ,000 IU every week and will follow up for routine testing of vitamin D, at least 2-3 times per year. She was informed of the risk of over-replacement of vitamin D and agrees to not increase her dose unless she discusses this with [place] first.  Cheryl Watson agrees to follow up with our clinic in 2 weeks.  Hyperlipidemia Cheryl Watson was informed of the American Heart Association Guidelines emphasizing intensive lifestyle modifications as the first line treatment for hyperlipidemia. We discussed many lifestyle modifications today in depth, and Cheryl Watson will continue to work on decreasing saturated fats such as fatty red meat, butter and many fried foods. She will also increase vegetables and lean protein in her diet and continue to work on exercise and weight loss efforts. We will repeat labs in mid January. Cheryl Watson agrees to follow up with our clinic in 2 weeks.  I spent > than 50% of the 15 minute visit on counseling  as documented in the note.  Obesity Cheryl Watson is currently in the action stage of change. As such, her goal is to continue with weight loss efforts She has agreed to follow the Category 3 plan Cheryl Watson has been instructed to work up to a goal of 150 minutes of combined cardio and strengthening exercise per week for weight loss and overall health benefits. We discussed the following Behavioral Modification Strategies today: increasing lean protein intake, increasing vegetables, work on meal planning and easy cooking plans, and planning for success   Cheryl Watson has agreed to follow up with our clinic in 2 weeks. She was informed of the importance of frequent follow up visits to maximize her success with intensive lifestyle modifications for her multiple health conditions.   OBESITY BEHAVIORAL INTERVENTION VISIT  Today's visit was # 5   Starting weight: 195 lbs Starting date: 09/14/18 Today's weight : 190 lbs Today's date: 11/11/2018 Total lbs lost to date: 5    ASK: We discussed the diagnosis of obesity with Cheryl Watson today and Cheryl Watson agreed to give [place] permission to discuss obesity behavioral modification therapy today.  ASSESS: Cheryl Watson has the diagnosis of obesity and her BMI today is 30.68 Cheryl Watson  is in the action stage of change   ADVISE: Cheryl Watson was educated on the multiple health risks of obesity as well as the benefit of weight loss to improve her health. She was advised of the need for long term treatment and the importance of lifestyle modifications to improve her current health and to decrease her risk of future health problems.  AGREE: Multiple dietary modification options and treatment options were discussed and  Cheryl Watson agreed to follow the recommendations documented in the above note.  ARRANGE: Cheryl Watson was educated on the importance of frequent visits to treat obesity as outlined per CMS and USPSTF guidelines and agreed to schedule her next follow up appointment today.  I, Trixie Dredge, am acting as transcriptionist for Ilene Qua, MD  I have reviewed the above documentation for accuracy and completeness, and I agree with the above. - Ilene Qua, MD

## 2018-11-19 ENCOUNTER — Ambulatory Visit (INDEPENDENT_AMBULATORY_CARE_PROVIDER_SITE_OTHER): Payer: 59 | Admitting: Internal Medicine

## 2018-11-19 ENCOUNTER — Encounter: Payer: Self-pay | Admitting: Internal Medicine

## 2018-11-19 VITALS — BP 110/78 | HR 91 | Temp 98.3°F | Ht 66.0 in | Wt 188.0 lb

## 2018-11-19 DIAGNOSIS — J0100 Acute maxillary sinusitis, unspecified: Secondary | ICD-10-CM

## 2018-11-19 DIAGNOSIS — J22 Unspecified acute lower respiratory infection: Secondary | ICD-10-CM | POA: Diagnosis not present

## 2018-11-19 DIAGNOSIS — E669 Obesity, unspecified: Secondary | ICD-10-CM

## 2018-11-19 DIAGNOSIS — Z6830 Body mass index (BMI) 30.0-30.9, adult: Secondary | ICD-10-CM | POA: Diagnosis not present

## 2018-11-19 MED ORDER — METHYLPREDNISOLONE ACETATE 80 MG/ML IJ SUSP
80.0000 mg | Freq: Once | INTRAMUSCULAR | Status: AC
  Administered 2018-11-19: 80 mg via INTRAMUSCULAR

## 2018-11-19 MED ORDER — HYDROCODONE-HOMATROPINE 5-1.5 MG/5ML PO SYRP: 5.0000 mL | ORAL_SOLUTION | Freq: Three times a day (TID) | ORAL | 0 refills | Status: DC | PRN

## 2018-11-19 MED ORDER — LEVOFLOXACIN 500 MG PO TABS: 500.0000 mg | ORAL_TABLET | Freq: Every day | ORAL | 0 refills | Status: DC

## 2018-11-19 MED ORDER — FLUCONAZOLE 150 MG PO TABS: 150.0000 mg | ORAL_TABLET | Freq: Once | ORAL | 0 refills | Status: AC

## 2018-11-19 NOTE — Progress Notes (Signed)
   Subjective:    Patient ID: Cheryl Watson, female    DOB: Feb 04, 1971, 47 y.o.   MRN: [id number]  HPI 47 year old Female who is come down with respiratory infection symptoms.  Sounds nasally congested.  Has cough and sinus pressure.  Has malaise and fatigue.  Beginning to have discolored sputum production.  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    Review of Systems see above-some shortness of breath and occasional wheeze.  Continues to see Dr. Adair Patter for healthy weight management     Objective:   Physical Exam Skin warm and dry.  Nodes none.  TMs slightly full bilaterally but not red.  Pharynx very slightly injected.  Neck is supple.  Chest: Has some coarse breath sounds on deep inspiration.  No rales.  She sounds nasally congested.  Has congested cough.       Assessment & Plan:  Acute maxillary sinusitis  Acute lower respiratory infection  Plan: Depo-Medrol 80 mg IM.  Hycodan 1 teaspoon p.o. every 8 hours as needed cough.  Levaquin 500 mg daily for 10 days.  Diflucan if needed for Candida vaginitis while on antibiotics.  Rest and drink plenty of fluids.

## 2018-11-21 NOTE — Patient Instructions (Signed)
Levaquin 500 mg daily for 10 days.  Depo-Medrol 80 mg IM.  Hycodan 1 teaspoon p.o. every 8 hours as needed cough.  Rest and drink plenty of fluids.  Diflucan if needed for Candida vaginitis.

## 2018-12-02 ENCOUNTER — Ambulatory Visit (INDEPENDENT_AMBULATORY_CARE_PROVIDER_SITE_OTHER): Payer: 59 | Admitting: Family Medicine

## 2018-12-02 VITALS — BP 119/77 | HR 95 | Temp 98.1°F | Ht 66.0 in | Wt 186.0 lb

## 2018-12-02 DIAGNOSIS — E669 Obesity, unspecified: Secondary | ICD-10-CM

## 2018-12-02 DIAGNOSIS — Z9189 Other specified personal risk factors, not elsewhere classified: Secondary | ICD-10-CM | POA: Diagnosis not present

## 2018-12-02 DIAGNOSIS — E559 Vitamin D deficiency, unspecified: Secondary | ICD-10-CM

## 2018-12-02 DIAGNOSIS — E7849 Other hyperlipidemia: Secondary | ICD-10-CM | POA: Diagnosis not present

## 2018-12-02 DIAGNOSIS — Z6830 Body mass index (BMI) 30.0-30.9, adult: Secondary | ICD-10-CM

## 2018-12-02 MED ORDER — VITAMIN D (ERGOCALCIFEROL) 1.25 MG (50000 UNIT) PO CAPS: 50000.0000 [IU] | ORAL_CAPSULE | ORAL | 0 refills | Status: DC

## 2018-12-07 NOTE — Progress Notes (Signed)
Office: [phone]  /  Fax: [phone]   HPI:   Chief Complaint: OBESITY Cheryl Watson is here to discuss her progress with her obesity treatment plan. She is on the Category 3 plan and is following her eating plan approximately 50 % of the time. She states she is exercising 0 minutes 0 times per week. Cheryl Watson did indulge on Thanksgiving but not much. She hasn't stuck to the meal plan strictly but tried to make good choices. She notes no holiday parties coming up.  Her weight is 186 lb (84.4 kg) today and has had a weight loss of 4 pounds over a period of 3 weeks since her last visit. She has lost 9 lbs since starting treatment with Cheryl Watson.  Vitamin D Deficiency Cheryl Watson has a diagnosis of vitamin D deficiency. She is currently taking prescription Vit D. She notes fatigue and denies nausea, vomiting or muscle weakness.  At risk for osteopenia and osteoporosis Cheryl Watson is at higher risk of osteopenia and osteoporosis due to vitamin D deficiency.   Hyperlipidemia Cheryl Watson has hyperlipidemia and has been trying to improve her cholesterol levels with intensive lifestyle modification including a low saturated fat diet, exercise and weight loss. She is not on statin and LDL of 114. She denies any chest pain, claudication or myalgias.  ALLERGIES: Allergies  Allergen Reactions  . Penicillins     All cilin drugs    MEDICATIONS: Current Outpatient Medications on File Prior to Visit  Medication Sig Dispense Refill  . aspirin EC 81 MG tablet Take 1 tablet by mouth daily.    Marland Kitchen HYDROcodone-homatropine (HYCODAN) 5-1.5 MG/5ML syrup Take 5 mLs by mouth every 8 (eight) hours as needed for cough. 120 mL 0  . levofloxacin (LEVAQUIN) 500 MG tablet Take 1 tablet (500 mg total) by mouth daily. 10 tablet 0   No current facility-administered medications on file prior to visit.     PAST MEDICAL HISTORY: Past Medical History:  Diagnosis Date  . Back pain   . Constipation   . Joint pain   .  Palpitation   . Shortness of breath   . Vitamin D deficiency     PAST SURGICAL HISTORY: Past Surgical History:  Procedure Laterality Date  . CESAREAN SECTION    . CHOLECYSTECTOMY    . TUBAL LIGATION      SOCIAL HISTORY: Social History   Tobacco Use  . Smoking status: Former Research scientist (life sciences)  . Smokeless tobacco: Former Systems developer    Quit date: 06/07/2016  Substance Use Topics  . Alcohol use: No    Alcohol/week: 0.0 standard drinks  . Drug use: No    FAMILY HISTORY: Family History  Problem Relation Age of Onset  . Depression Mother   . Obesity Mother   . Neuropathy Father   . Alcohol abuse Father   . High blood pressure Father   . High Cholesterol Father   . Depression Father   . Sleep apnea Father   . Drug abuse Father   . Breast cancer Paternal Aunt     ROS: Review of Systems  Constitutional: Positive for malaise/fatigue and weight loss.  Cardiovascular: Negative for chest pain and claudication.  Gastrointestinal: Negative for nausea and vomiting.  Musculoskeletal: Negative for myalgias.       Negative muscle weakness    PHYSICAL EXAM: Blood pressure 119/77, pulse 95, temperature 98.1 F (36.7 C), temperature source Oral, height 5\' 6"  (1.676 m), weight 186 lb (84.4 kg), SpO2 98 %. Body mass index is 30.02 kg/m. Physical Exam  Constitutional: She is oriented to person, place, and time. She appears well-developed and well-nourished.  Cardiovascular: Normal rate.  Pulmonary/Chest: Effort normal.  Musculoskeletal: Normal range of motion.  Neurological: She is oriented to person, place, and time.  Skin: Skin is warm and dry.  Psychiatric: She has a normal mood and affect. Her behavior is normal.  Vitals reviewed.   RECENT LABS AND TESTS: BMET    Component Value Date/Time   NA 139 07/17/2018 0940   K 4.4 07/17/2018 0940   CL 108 07/17/2018 0940   CO2 22 07/17/2018 0940   GLUCOSE 94 07/17/2018 0940   BUN 11 07/17/2018 0940   CREATININE 0.65 07/17/2018 0940    CALCIUM 9.1 07/17/2018 0940   GFRNONAA 105 07/17/2018 0940   GFRAA 122 07/17/2018 0940   Lab Results  Component Value Date   HGBA1C 5.5 09/14/2018   HGBA1C 5.2 05/27/2017   Lab Results  Component Value Date   INSULIN 8.1 09/14/2018   CBC    Component Value Date/Time   WBC 8.5 07/17/2018 0940   RBC 4.17 07/17/2018 0940   HGB 12.6 07/17/2018 0940   HCT 37.7 07/17/2018 0940   PLT 280 07/17/2018 0940   MCV 90.4 07/17/2018 0940   MCH 30.2 07/17/2018 0940   MCHC 33.4 07/17/2018 0940   RDW 12.6 07/17/2018 0940   LYMPHSABS 2,193 07/17/2018 0940   MONOABS 639 04/01/2016 0859   EOSABS 136 07/17/2018 0940   BASOSABS 51 07/17/2018 0940   Iron/TIBC/Ferritin/ %Sat No results found for: IRON, TIBC, FERRITIN, IRONPCTSAT Lipid Panel     Component Value Date/Time   CHOL 186 07/17/2018 0940   TRIG 103 07/17/2018 0940   HDL 51 07/17/2018 0940   CHOLHDL 3.6 07/17/2018 0940   VLDL 25 05/27/2017 1035   LDLCALC 114 (H) 07/17/2018 0940   Hepatic Function Panel     Component Value Date/Time   PROT 6.5 07/17/2018 0940   ALBUMIN 3.8 05/27/2017 1035   AST 14 07/17/2018 0940   ALT 13 07/17/2018 0940   ALKPHOS 52 05/27/2017 1035   BILITOT 0.5 07/17/2018 0940      Component Value Date/Time   TSH 1.140 09/14/2018 1219   TSH 1.56 07/17/2018 0940   TSH 0.94 05/27/2017 1035  Results for RING, Cheryl D "[name]" (MRN [id number]) as of 12/07/2018 17:38  Ref. Range 07/17/2018 09:40  Vitamin D, 25-Hydroxy Latest Ref Range: 30 - 100 ng/mL 27 (L)    ASSESSMENT AND PLAN: Vitamin D deficiency - Plan: Vitamin D, Ergocalciferol, (DRISDOL) 1.25 MG (50000 UT) CAPS capsule  Other hyperlipidemia  At risk for osteoporosis  Class 1 obesity with serious comorbidity and body mass index (BMI) of 30.0 to 30.9 in adult, unspecified obesity type  PLAN:  Vitamin D Deficiency Alisse was informed that low vitamin D levels contributes to fatigue and are associated with obesity, breast, and colon cancer.  Suella agrees to continue taking prescription Vit D @50 ,000 IU every week #4 and we will refill for 1 month. She will follow up for routine testing of vitamin D, at least 2-3 times per year. She was informed of the risk of over-replacement of vitamin D and agrees to not increase her dose unless she discusses this with Cheryl Watson first. Marquel agrees to follow up with our clinic in 2 weeks.  At risk for osteopenia and osteoporosis Kerisha was given extended (15 minutes) osteoporosis prevention counseling today. Denim is at risk for osteopenia and osteoporsis due to her vitamin D deficiency. She was encouraged to  take her vitamin D and follow her higher calcium diet and increase strengthening exercise to help strengthen her bones and decrease her risk of osteopenia and osteoporosis.  Hyperlipidemia Landyn was informed of the American Heart Association Guidelines emphasizing intensive lifestyle modifications as the first line treatment for hyperlipidemia. We discussed many lifestyle modifications today in depth, and Gearldene will continue to work on decreasing saturated fats such as fatty red meat, butter and many fried foods. She will also increase vegetables and lean protein in her diet and continue to work on exercise and weight loss efforts. We will repeat labs in early January. James agrees to follow up with our clinic in 2 weeks.  Obesity Edee is currently in the action stage of change. As such, her goal is to continue with weight loss efforts She has agreed to follow the Category 3 plan Adrienne has been instructed to work up to a goal of 150 minutes of combined cardio and strengthening exercise per week for weight loss and overall health benefits. We discussed the following Behavioral Modification Strategies today: increasing lean protein intake, increasing vegetables, work on meal planning and easy cooking plans, holiday eating strategies, and planning for success     Lilliauna has agreed to follow up with our clinic in 2 weeks. She was informed of the importance of frequent follow up visits to maximize her success with intensive lifestyle modifications for her multiple health conditions.   OBESITY BEHAVIORAL INTERVENTION VISIT  Today's visit was # 6   Starting weight: 195 lbs Starting date: 09/14/18 Today's weight : 186 lbs Today's date: 12/02/2018 Total lbs lost to date: 9    ASK: We discussed the diagnosis of obesity with Jacobo Forest today and Arliene agreed to give Cheryl Watson permission to discuss obesity behavioral modification therapy today.  ASSESS: Lucindia has the diagnosis of obesity and her BMI today is 30.04 Peta is in the action stage of change   ADVISE: Nilaya was educated on the multiple health risks of obesity as well as the benefit of weight loss to improve her health. She was advised of the need for long term treatment and the importance of lifestyle modifications to improve her current health and to decrease her risk of future health problems.  AGREE: Multiple dietary modification options and treatment options were discussed and  Shyvonne agreed to follow the recommendations documented in the above note.  ARRANGE: Adamary was educated on the importance of frequent visits to treat obesity as outlined per CMS and USPSTF guidelines and agreed to schedule her next follow up appointment today.  I, Trixie Dredge, am acting as transcriptionist for Ilene Qua, MD  I have reviewed the above documentation for accuracy and completeness, and I agree with the above. - Ilene Qua, MD

## 2018-12-17 ENCOUNTER — Ambulatory Visit (INDEPENDENT_AMBULATORY_CARE_PROVIDER_SITE_OTHER): Payer: 59 | Admitting: Family Medicine

## 2018-12-17 ENCOUNTER — Encounter (INDEPENDENT_AMBULATORY_CARE_PROVIDER_SITE_OTHER): Payer: Self-pay | Admitting: Family Medicine

## 2018-12-17 VITALS — BP 114/74 | HR 86 | Temp 98.2°F | Ht 66.0 in | Wt 184.0 lb

## 2018-12-17 DIAGNOSIS — E7849 Other hyperlipidemia: Secondary | ICD-10-CM

## 2018-12-17 DIAGNOSIS — Z6830 Body mass index (BMI) 30.0-30.9, adult: Secondary | ICD-10-CM

## 2018-12-17 DIAGNOSIS — E559 Vitamin D deficiency, unspecified: Secondary | ICD-10-CM | POA: Diagnosis not present

## 2018-12-17 DIAGNOSIS — E669 Obesity, unspecified: Secondary | ICD-10-CM

## 2018-12-17 NOTE — Progress Notes (Signed)
Office: [phone]  /  Fax: [phone]   HPI:   Chief Complaint: OBESITY Cheryl Watson is here to discuss her progress with her obesity treatment plan. She is on the Category 3 plan and is following her eating plan approximately 80 % of the time. She states she is exercising 0 minutes 0 times per week. Adja cooked for Thanksgiving and kept no leftover at her house afterwards. She has had no holiday parties this year. She plans to have similar Thanksgiving type of meal at Christmas. She voices she has been more laxed on following the meal plan.  Her weight is 184 lb (83.5 kg) today and has had a weight loss of 2 pounds over a period of 2 weeks since her last visit. She has lost 11 lbs since starting treatment with [place].  Vitamin D Deficiency Cheryl Watson has a diagnosis of vitamin D deficiency. She is currently taking prescription Vit D. She notes fatigue and denies nausea, vomiting or muscle weakness.  Hyperlipidemia Cheryl Watson has hyperlipidemia and has been trying to improve her cholesterol levels with intensive lifestyle modification including a low saturated fat diet, exercise and weight loss. Her LDL is elevated and she denies any chest pain, claudication or myalgias.  ASSESSMENT AND PLAN:  Vitamin D deficiency  Other hyperlipidemia  Class 1 obesity with serious comorbidity and body mass index (BMI) of 30.0 to 30.9 in adult, unspecified obesity type - Starting BMI greater then 30  PLAN:  Vitamin D Deficiency [facility] was informed that low vitamin D levels contributes to fatigue and are associated with obesity, breast, and colon cancer. Cheryl Watson agrees to continue taking prescription Vit D @50 ,000 IU every week, no refill needed. She will follow up for routine testing of vitamin D, at least 2-3 times per year. She was informed of the risk of over-replacement of vitamin D and agrees to not increase her dose unless she discusses this with [place] first. Cheryl Watson agrees to follow up with  our clinic in 2 weeks.  Hyperlipidemia Cheryl Watson was informed of the American Heart Association Guidelines emphasizing intensive lifestyle modifications as the first line treatment for hyperlipidemia. We discussed many lifestyle modifications today in depth, and Cheryl Watson will continue to work on decreasing saturated fats such as fatty red meat, butter and many fried foods. She will also increase vegetables and lean protein in her diet and continue to work on exercise and weight loss efforts. We will repeat FLP in early January. Cheryl Watson agrees to follow up with our clinic in 2 weeks.  I spent > than 50% of the 15 minute visit on counseling as documented in the note.  Obesity Cheryl Watson is currently in the action stage of change. As such, her goal is to continue with weight loss efforts She has agreed to follow the Category 3 plan Cheryl Watson has been instructed to work up to a goal of 150 minutes of combined cardio and strengthening exercise per week for weight loss and overall health benefits. We discussed the following Behavioral Modification Strategies today: increasing lean protein intake, work on meal planning and easy cooking plans, holiday eating strategies, better snacking choices, and planning for success  We will discuss addition of physical activity at next appointment.  Cheryl Watson has agreed to follow up with our clinic in 2 weeks. She was informed of the importance of frequent follow up visits to maximize her success with intensive lifestyle modifications for her multiple health conditions.  ALLERGIES: Allergies  Allergen Reactions  . Penicillins     All cilin  drugs    MEDICATIONS: Current Outpatient Medications on File Prior to Visit  Medication Sig Dispense Refill  . aspirin EC 81 MG tablet Take 1 tablet by mouth daily.    . Vitamin D, Ergocalciferol, (DRISDOL) 1.25 MG (50000 UT) CAPS capsule Take 1 capsule (50,000 Units total) by mouth every 7 (seven) days. 4 capsule 0    No current facility-administered medications on file prior to visit.     PAST MEDICAL HISTORY: Past Medical History:  Diagnosis Date  . Back pain   . Constipation   . Joint pain   . Palpitation   . Shortness of breath   . Vitamin D deficiency     PAST SURGICAL HISTORY: Past Surgical History:  Procedure Laterality Date  . CESAREAN SECTION    . CHOLECYSTECTOMY    . TUBAL LIGATION      SOCIAL HISTORY: Social History   Tobacco Use  . Smoking status: Former Research scientist (life sciences)  . Smokeless tobacco: Former Systems developer    Quit date: 06/07/2016  Substance Use Topics  . Alcohol use: No    Alcohol/week: 0.0 standard drinks  . Drug use: No    FAMILY HISTORY: Family History  Problem Relation Age of Onset  . Depression Mother   . Obesity Mother   . Neuropathy Father   . Alcohol abuse Father   . High blood pressure Father   . High Cholesterol Father   . Depression Father   . Sleep apnea Father   . Drug abuse Father   . Breast cancer Paternal Aunt     ROS: Review of Systems  Constitutional: Positive for malaise/fatigue and weight loss.  Cardiovascular: Negative for chest pain and claudication.  Gastrointestinal: Negative for nausea and vomiting.  Musculoskeletal: Negative for myalgias.       Negative muscle weakness    PHYSICAL EXAM: Blood pressure 114/74, pulse 86, temperature 98.2 F (36.8 C), temperature source Oral, height 5\' 6"  (1.676 m), weight 184 lb (83.5 kg), SpO2 98 %. Body mass index is 29.7 kg/m. Physical Exam Vitals signs reviewed.  Constitutional:      Appearance: Normal appearance. She is obese.  Cardiovascular:     Rate and Rhythm: Normal rate.     Pulses: Normal pulses.  Pulmonary:     Effort: Pulmonary effort is normal.  Musculoskeletal: Normal range of motion.  Skin:    General: Skin is warm and dry.  Neurological:     Mental Status: She is alert and oriented to person, place, and time.  Psychiatric:        Mood and Affect: Mood normal.         Behavior: Behavior normal.     RECENT LABS AND TESTS: BMET    Component Value Date/Time   NA 139 07/17/2018 0940   K 4.4 07/17/2018 0940   CL 108 07/17/2018 0940   CO2 22 07/17/2018 0940   GLUCOSE 94 07/17/2018 0940   BUN 11 07/17/2018 0940   CREATININE 0.65 07/17/2018 0940   CALCIUM 9.1 07/17/2018 0940   GFRNONAA 105 07/17/2018 0940   GFRAA 122 07/17/2018 0940   Lab Results  Component Value Date   HGBA1C 5.5 09/14/2018   HGBA1C 5.2 05/27/2017   Lab Results  Component Value Date   INSULIN 8.1 09/14/2018   CBC    Component Value Date/Time   WBC 8.5 07/17/2018 0940   RBC 4.17 07/17/2018 0940   HGB 12.6 07/17/2018 0940   HCT 37.7 07/17/2018 0940   PLT 280 07/17/2018 0940  MCV 90.4 07/17/2018 0940   MCH 30.2 07/17/2018 0940   MCHC 33.4 07/17/2018 0940   RDW 12.6 07/17/2018 0940   LYMPHSABS 2,193 07/17/2018 0940   MONOABS 639 04/01/2016 0859   EOSABS 136 07/17/2018 0940   BASOSABS 51 07/17/2018 0940   Iron/TIBC/Ferritin/ %Sat No results found for: IRON, TIBC, FERRITIN, IRONPCTSAT Lipid Panel     Component Value Date/Time   CHOL 186 07/17/2018 0940   TRIG 103 07/17/2018 0940   HDL 51 07/17/2018 0940   CHOLHDL 3.6 07/17/2018 0940   VLDL 25 05/27/2017 1035   LDLCALC 114 (H) 07/17/2018 0940   Hepatic Function Panel     Component Value Date/Time   PROT 6.5 07/17/2018 0940   ALBUMIN 3.8 05/27/2017 1035   AST 14 07/17/2018 0940   ALT 13 07/17/2018 0940   ALKPHOS 52 05/27/2017 1035   BILITOT 0.5 07/17/2018 0940      Component Value Date/Time   TSH 1.140 09/14/2018 1219   TSH 1.56 07/17/2018 0940   TSH 0.94 05/27/2017 1035      OBESITY BEHAVIORAL INTERVENTION VISIT  Today's visit was # 7   Starting weight: 195 lbs Starting date: 09/14/18 Today's weight : 184 lbs Today's date: 12/17/2018 Total lbs lost to date: 11    ASK: We discussed the diagnosis of obesity with Cheryl Watson today and Cheryl Watson agreed to give [place] permission to discuss  obesity behavioral modification therapy today.  ASSESS: Cheryl Watson has the diagnosis of obesity and her BMI today is 29.71 Cheryl Watson is in the action stage of change   ADVISE: Cheryl Watson was educated on the multiple health risks of obesity as well as the benefit of weight loss to improve her health. She was advised of the need for long term treatment and the importance of lifestyle modifications to improve her current health and to decrease her risk of future health problems.  AGREE: Multiple dietary modification options and treatment options were discussed and  Cheryl Watson agreed to follow the recommendations documented in the above note.  ARRANGE: Cheryl Watson was educated on the importance of frequent visits to treat obesity as outlined per CMS and USPSTF guidelines and agreed to schedule her next follow up appointment today.  I, Trixie Dredge, am acting as transcriptionist for Cheryl Qua, MD  I have reviewed the above documentation for accuracy and completeness, and I agree with the above. - Cheryl Qua, MD

## 2019-01-07 ENCOUNTER — Encounter (INDEPENDENT_AMBULATORY_CARE_PROVIDER_SITE_OTHER): Payer: Self-pay | Admitting: Family Medicine

## 2019-01-07 ENCOUNTER — Ambulatory Visit (INDEPENDENT_AMBULATORY_CARE_PROVIDER_SITE_OTHER): Payer: 59 | Admitting: Family Medicine

## 2019-01-07 VITALS — BP 108/74 | HR 89 | Temp 98.3°F | Ht 66.0 in | Wt 184.0 lb

## 2019-01-07 DIAGNOSIS — E785 Hyperlipidemia, unspecified: Secondary | ICD-10-CM | POA: Diagnosis not present

## 2019-01-07 DIAGNOSIS — Z6830 Body mass index (BMI) 30.0-30.9, adult: Secondary | ICD-10-CM

## 2019-01-07 DIAGNOSIS — E1169 Type 2 diabetes mellitus with other specified complication: Secondary | ICD-10-CM

## 2019-01-07 DIAGNOSIS — E669 Obesity, unspecified: Secondary | ICD-10-CM | POA: Diagnosis not present

## 2019-01-07 DIAGNOSIS — E559 Vitamin D deficiency, unspecified: Secondary | ICD-10-CM | POA: Diagnosis not present

## 2019-01-09 NOTE — Progress Notes (Signed)
Office: [phone]  /  Fax: [phone]   HPI:   Chief Complaint: OBESITY Cheryl Watson is here to discuss her progress with her obesity treatment plan. She is on the Category 3 plan and is following her eating plan approximately 75 % of the time. She states she is exercising 0 minutes 0 times per week. Cheryl Watson had low key holidays, went to a few holiday festivities and had some indulgences on New Year's Eve. She is trying to get back to the meal plan for the past few weeks. Her weight is 184 lb (83.5 kg) today and has not lost weight since her last visit. She has lost 11 lbs since starting treatment with [place].  Vitamin D Deficiency Cheryl Watson has a diagnosis of vitamin D deficiency. She is currently taking prescription Vit D. She notes fatigue and denies nausea, vomiting or muscle weakness.  Hyperlipidemia Cheryl Watson has hyperlipidemia and has been trying to improve her cholesterol levels with intensive lifestyle modification including a low saturated fat diet, exercise and weight loss. Her last LDL was elevated and she is not on medications. She denies any chest pain, claudication or myalgias.  ASSESSMENT AND PLAN:  Vitamin D deficiency  Hyperlipidemia associated with type 2 diabetes mellitus (HCC)  Class 1 obesity with serious comorbidity and body mass index (BMI) of 30.0 to 30.9 in adult, unspecified obesity type - Starting BMI greater then 30  PLAN:  Vitamin D Deficiency Cheryl Watson was informed that low vitamin D levels contributes to fatigue and are associated with obesity, breast, and colon cancer. Cheryl Watson agrees to continue taking prescription Vit D @50 ,000 IU every week and will follow up for routine testing of vitamin D, at least 2-3 times per year. She was informed of the risk of over-replacement of vitamin D and agrees to not increase her dose unless she discusses this with [place] first. Cheryl Watson agrees to follow up with our clinic in 2 and 1/2  weeks.  Hyperlipidemia Cheryl Watson was informed of the American Heart Association Guidelines emphasizing intensive lifestyle modifications as the first line treatment for hyperlipidemia. We discussed many lifestyle modifications today in depth, and Cheryl Watson will continue to work on decreasing saturated fats such as fatty red meat, butter and many fried foods. She will also increase vegetables and lean protein in her diet and continue to work on exercise and weight loss efforts. We will repeat labs at next appointment. Cheryl Watson agrees to follow up with our clinic in 2 and 1/2 weeks.  I spent > than 50% of the 15 minute visit on counseling as documented in the note.  Obesity Cheryl Watson is currently in the action stage of change. As such, her goal is to continue with weight loss efforts She has agreed to follow the Category 3 plan Cheryl Watson has been instructed to work up to a goal of 150 minutes of combined cardio and strengthening exercise per week or [employer] circuit training 3 times per week for weight loss and overall health benefits. We discussed the following Behavioral Modification Strategies today: increasing lean protein intake, increasing vegetables, work on meal planning and easy cooking plans, avoiding temptations, and planning for success   Cheryl Watson has agreed to follow up with our clinic in 2 and 1/2 weeks. She was informed of the importance of frequent follow up visits to maximize her success with intensive lifestyle modifications for her multiple health conditions.  ALLERGIES: Allergies  Allergen Reactions  . Penicillins     All cilin drugs    MEDICATIONS: Current Outpatient Medications  on File Prior to Visit  Medication Sig Dispense Refill  . aspirin EC 81 MG tablet Take 1 tablet by mouth daily.    . Vitamin D, Ergocalciferol, (DRISDOL) 1.25 MG (50000 UT) CAPS capsule Take 1 capsule (50,000 Units total) by mouth every 7 (seven) days. 4 capsule 0   No current  facility-administered medications on file prior to visit.     PAST MEDICAL HISTORY: Past Medical History:  Diagnosis Date  . Back pain   . Constipation   . Joint pain   . Palpitation   . Shortness of breath   . Vitamin D deficiency     PAST SURGICAL HISTORY: Past Surgical History:  Procedure Laterality Date  . CESAREAN SECTION    . CHOLECYSTECTOMY    . TUBAL LIGATION      SOCIAL HISTORY: Social History   Tobacco Use  . Smoking status: Former Research scientist (life sciences)  . Smokeless tobacco: Former Systems developer    Quit date: 06/07/2016  Substance Use Topics  . Alcohol use: No    Alcohol/week: 0.0 standard drinks  . Drug use: No    FAMILY HISTORY: Family History  Problem Relation Age of Onset  . Depression Mother   . Obesity Mother   . Neuropathy Father   . Alcohol abuse Father   . High blood pressure Father   . High Cholesterol Father   . Depression Father   . Sleep apnea Father   . Drug abuse Father   . Breast cancer Paternal Aunt     ROS: Review of Systems  Constitutional: Positive for malaise/fatigue. Negative for weight loss.  Cardiovascular: Negative for chest pain and claudication.  Gastrointestinal: Negative for nausea and vomiting.  Musculoskeletal: Negative for myalgias.       Negative muscle weakness    PHYSICAL EXAM: Blood pressure 108/74, pulse 89, temperature 98.3 F (36.8 C), temperature source Oral, height 5\' 6"  (1.676 m), weight 184 lb (83.5 kg), SpO2 97 %. Body mass index is 29.7 kg/m. Physical Exam Vitals signs reviewed.  Constitutional:      Appearance: Normal appearance. She is obese.  Cardiovascular:     Rate and Rhythm: Normal rate.     Pulses: Normal pulses.  Pulmonary:     Effort: Pulmonary effort is normal.     Breath sounds: Normal breath sounds.  Musculoskeletal: Normal range of motion.  Skin:    General: Skin is warm and dry.  Neurological:     Mental Status: She is alert and oriented to person, place, and time.  Psychiatric:        Mood and  Affect: Mood normal.        Behavior: Behavior normal.     RECENT LABS AND TESTS: BMET    Component Value Date/Time   NA 139 07/17/2018 0940   K 4.4 07/17/2018 0940   CL 108 07/17/2018 0940   CO2 22 07/17/2018 0940   GLUCOSE 94 07/17/2018 0940   BUN 11 07/17/2018 0940   CREATININE 0.65 07/17/2018 0940   CALCIUM 9.1 07/17/2018 0940   GFRNONAA 105 07/17/2018 0940   GFRAA 122 07/17/2018 0940   Lab Results  Component Value Date   HGBA1C 5.5 09/14/2018   HGBA1C 5.2 05/27/2017   Lab Results  Component Value Date   INSULIN 8.1 09/14/2018   CBC    Component Value Date/Time   WBC 8.5 07/17/2018 0940   RBC 4.17 07/17/2018 0940   HGB 12.6 07/17/2018 0940   HCT 37.7 07/17/2018 0940   PLT 280 07/17/2018  0940   MCV 90.4 07/17/2018 0940   MCH 30.2 07/17/2018 0940   MCHC 33.4 07/17/2018 0940   RDW 12.6 07/17/2018 0940   LYMPHSABS 2,193 07/17/2018 0940   MONOABS 639 04/01/2016 0859   EOSABS 136 07/17/2018 0940   BASOSABS 51 07/17/2018 0940   Iron/TIBC/Ferritin/ %Sat No results found for: IRON, TIBC, FERRITIN, IRONPCTSAT Lipid Panel     Component Value Date/Time   CHOL 186 07/17/2018 0940   TRIG 103 07/17/2018 0940   HDL 51 07/17/2018 0940   CHOLHDL 3.6 07/17/2018 0940   VLDL 25 05/27/2017 1035   LDLCALC 114 (H) 07/17/2018 0940   Hepatic Function Panel     Component Value Date/Time   PROT 6.5 07/17/2018 0940   ALBUMIN 3.8 05/27/2017 1035   AST 14 07/17/2018 0940   ALT 13 07/17/2018 0940   ALKPHOS 52 05/27/2017 1035   BILITOT 0.5 07/17/2018 0940      Component Value Date/Time   TSH 1.140 09/14/2018 1219   TSH 1.56 07/17/2018 0940   TSH 0.94 05/27/2017 1035      OBESITY BEHAVIORAL INTERVENTION VISIT  Today's visit was # 8   Starting weight: 195 lbs Starting date: 09/14/18 Today's weight : 184 lbs  Today's date: 01/07/2019 Total lbs lost to date: 11    ASK: We discussed the diagnosis of obesity with Alessandra Grout Ring today and Lella agreed to give  [place] permission to discuss obesity behavioral modification therapy today.  ASSESS: Joselyn has the diagnosis of obesity and her BMI today is 29.71 Abbigayle is in the action stage of change   ADVISE: Mataya was educated on the multiple health risks of obesity as well as the benefit of weight loss to improve her health. She was advised of the need for long term treatment and the importance of lifestyle modifications to improve her current health and to decrease her risk of future health problems.  AGREE: Multiple dietary modification options and treatment options were discussed and  [place] agreed to follow the recommendations documented in the above note.  ARRANGE: Taiyana was educated on the importance of frequent visits to treat obesity as outlined per CMS and USPSTF guidelines and agreed to schedule her next follow up appointment today.  I, Trixie Dredge, am acting as transcriptionist for Ilene Qua, MD  I have reviewed the above documentation for accuracy and completeness, and I agree with the above. - Ilene Qua, MD

## 2019-01-26 ENCOUNTER — Ambulatory Visit (INDEPENDENT_AMBULATORY_CARE_PROVIDER_SITE_OTHER): Payer: 59 | Admitting: Physician Assistant

## 2019-01-26 ENCOUNTER — Encounter (INDEPENDENT_AMBULATORY_CARE_PROVIDER_SITE_OTHER): Payer: Self-pay | Admitting: Physician Assistant

## 2019-01-26 VITALS — BP 104/70 | HR 87 | Temp 98.1°F | Ht 66.0 in | Wt 185.0 lb

## 2019-01-26 DIAGNOSIS — Z9189 Other specified personal risk factors, not elsewhere classified: Secondary | ICD-10-CM

## 2019-01-26 DIAGNOSIS — E669 Obesity, unspecified: Secondary | ICD-10-CM | POA: Diagnosis not present

## 2019-01-26 DIAGNOSIS — E7849 Other hyperlipidemia: Secondary | ICD-10-CM | POA: Diagnosis not present

## 2019-01-26 DIAGNOSIS — Z6830 Body mass index (BMI) 30.0-30.9, adult: Secondary | ICD-10-CM | POA: Diagnosis not present

## 2019-01-26 DIAGNOSIS — E559 Vitamin D deficiency, unspecified: Secondary | ICD-10-CM | POA: Diagnosis not present

## 2019-01-26 MED ORDER — VITAMIN D (ERGOCALCIFEROL) 1.25 MG (50000 UNIT) PO CAPS: 50000.0000 [IU] | ORAL_CAPSULE | ORAL | 0 refills | Status: DC

## 2019-01-26 NOTE — Progress Notes (Signed)
Office: [phone]  /  Fax: [phone]   HPI:   Chief Complaint: OBESITY Cheryl Watson is here to discuss her progress with her obesity treatment plan. She is on the  keep a food journal with 450-600 calories and 40 grams of protein protein  and follow the Category 3 plan and is following her eating plan approximately 50 % of the time. She states she is walking 20 minutes 2 times per week. Cheryl Watson reports that she is getting bored with the plan, especially lunch and dinner. Her weight is 185 lb (83.9 kg) today and has gained 1 lbs since her last visit. She has lost 10 lbs since starting treatment with [place].  Vitamin D deficiency Cheryl Watson has a diagnosis of vitamin D deficiency. She is currently taking prescription Vit D and denies nausea, vomiting or muscle weakness.  Hyperlipidemia Cheryl Watson has hyperlipidemia and has been trying to improve her cholesterol levels with intensive lifestyle modification including a low saturated fat diet, exercise and weight loss. She is not on any medication. She denies any chest pain.  At risk for cardiovascular disease Cheryl Watson is at a higher than average risk for cardiovascular disease due to obesity. She currently denies any chest pain.   ASSESSMENT AND PLAN:  Vitamin D deficiency - Plan: Vitamin D, Ergocalciferol, (DRISDOL) 1.25 MG (50000 UT) CAPS capsule  Other hyperlipidemia  At risk for heart disease  Class 1 obesity with serious comorbidity and body mass index (BMI) of 30.0 to 30.9 in adult, unspecified obesity type - Starting BMI greater then 30  PLAN:  Vitamin D Deficiency Cheryl Watson was informed that low vitamin D levels contributes to fatigue and are associated with obesity, breast, and colon cancer. She agrees to continue to take prescription Vit D @50 ,000 IU every week #4 with no refills and will follow up for routine testing of vitamin D, at least 2-3 times per year. She was informed of the risk of over-replacement of vitamin D  and agrees to not increase her dose unless she discusses this with [place] first. Cheryl Watson agrees to follow up with our clinic in 2 weeks.  Hyperlipidemia Cheryl Watson was informed of the American Heart Association Guidelines emphasizing intensive lifestyle modifications as the first line treatment for hyperlipidemia. We discussed many lifestyle modifications today in depth, and Cheryl Watson will continue to work on decreasing saturated fats such as fatty red meat, butter and many fried foods. She will also increase vegetables and lean protein in her diet and continue to work on exercise and weight loss efforts. Cheryl Watson agrees to follow up with our clinic in 2 weeks.  Cardiovascular risk counseling Cheryl Watson was given extended (15 minutes) coronary artery disease prevention counseling today. She is 48 y.o. female and has risk factors for heart disease including obesity. We discussed intensive lifestyle modifications today with an emphasis on specific weight loss instructions and strategies. Pt was also informed of the importance of increasing exercise and decreasing saturated fats to help prevent heart disease.  Obesity Cheryl Watson is currently in the action stage of change. As such, her goal is to continue with weight loss efforts She has agreed to keep a food journal with 450-600 calories and 40 grams of protein with supper and follow the Category 3 plan Cheryl Watson has been instructed to work up to a goal of 150 minutes of combined cardio and strengthening exercise per week for weight loss and overall health benefits. We discussed the following Behavioral Modification Strategies today: work on meal planning and easy cooking plans and keeping  healthy foods in the home  Cheryl Watson has agreed to follow up with our clinic in 2 weeks. She was informed of the importance of frequent follow up visits to maximize her success with intensive lifestyle modifications for her multiple health  conditions.  ALLERGIES: Allergies  Allergen Reactions  . Penicillins     All cilin drugs    MEDICATIONS: Current Outpatient Medications on File Prior to Visit  Medication Sig Dispense Refill  . aspirin EC 81 MG tablet Take 1 tablet by mouth daily.     No current facility-administered medications on file prior to visit.     PAST MEDICAL HISTORY: Past Medical History:  Diagnosis Date  . Back pain   . Constipation   . Joint pain   . Palpitation   . Shortness of breath   . Vitamin D deficiency     PAST SURGICAL HISTORY: Past Surgical History:  Procedure Laterality Date  . CESAREAN SECTION    . CHOLECYSTECTOMY    . TUBAL LIGATION      SOCIAL HISTORY: Social History   Tobacco Use  . Smoking status: Former Research scientist (life sciences)  . Smokeless tobacco: Former Systems developer    Quit date: 06/07/2016  Substance Use Topics  . Alcohol use: No    Alcohol/week: 0.0 standard drinks  . Drug use: No    FAMILY HISTORY: Family History  Problem Relation Age of Onset  . Depression Mother   . Obesity Mother   . Neuropathy Father   . Alcohol abuse Father   . High blood pressure Father   . High Cholesterol Father   . Depression Father   . Sleep apnea Father   . Drug abuse Father   . Breast cancer Paternal Aunt     ROS: Review of Systems  Constitutional: Negative for weight loss.  Cardiovascular: Negative for chest pain.  Gastrointestinal: Negative for nausea and vomiting.  Musculoskeletal:       Negative for muscle weakness    PHYSICAL EXAM: Blood pressure 104/70, pulse 87, temperature 98.1 F (36.7 C), temperature source Oral, height 5\' 6"  (1.676 m), weight 185 lb (83.9 kg), SpO2 99 %. Body mass index is 29.86 kg/m. Physical Exam Vitals signs reviewed.  Constitutional:      Appearance: Normal appearance. She is obese.  Cardiovascular:     Rate and Rhythm: Normal rate.     Pulses: Normal pulses.  Pulmonary:     Effort: Pulmonary effort is normal.  Musculoskeletal: Normal range of  motion.  Skin:    General: Skin is warm and dry.  Neurological:     Mental Status: She is alert and oriented to person, place, and time.  Psychiatric:        Mood and Affect: Mood normal.        Behavior: Behavior normal.     RECENT LABS AND TESTS: BMET    Component Value Date/Time   NA 139 07/17/2018 0940   K 4.4 07/17/2018 0940   CL 108 07/17/2018 0940   CO2 22 07/17/2018 0940   GLUCOSE 94 07/17/2018 0940   BUN 11 07/17/2018 0940   CREATININE 0.65 07/17/2018 0940   CALCIUM 9.1 07/17/2018 0940   GFRNONAA 105 07/17/2018 0940   GFRAA 122 07/17/2018 0940   Lab Results  Component Value Date   HGBA1C 5.5 09/14/2018   HGBA1C 5.2 05/27/2017   Lab Results  Component Value Date   INSULIN 8.1 09/14/2018   CBC    Component Value Date/Time   WBC 8.5 07/17/2018 0940  RBC 4.17 07/17/2018 0940   HGB 12.6 07/17/2018 0940   HCT 37.7 07/17/2018 0940   PLT 280 07/17/2018 0940   MCV 90.4 07/17/2018 0940   MCH 30.2 07/17/2018 0940   MCHC 33.4 07/17/2018 0940   RDW 12.6 07/17/2018 0940   LYMPHSABS 2,193 07/17/2018 0940   MONOABS 639 04/01/2016 0859   EOSABS 136 07/17/2018 0940   BASOSABS 51 07/17/2018 0940   Iron/TIBC/Ferritin/ %Sat No results found for: IRON, TIBC, FERRITIN, IRONPCTSAT Lipid Panel     Component Value Date/Time   CHOL 186 07/17/2018 0940   TRIG 103 07/17/2018 0940   HDL 51 07/17/2018 0940   CHOLHDL 3.6 07/17/2018 0940   VLDL 25 05/27/2017 1035   LDLCALC 114 (H) 07/17/2018 0940   Hepatic Function Panel     Component Value Date/Time   PROT 6.5 07/17/2018 0940   ALBUMIN 3.8 05/27/2017 1035   AST 14 07/17/2018 0940   ALT 13 07/17/2018 0940   ALKPHOS 52 05/27/2017 1035   BILITOT 0.5 07/17/2018 0940      Component Value Date/Time   TSH 1.140 09/14/2018 1219   TSH 1.56 07/17/2018 0940   TSH 0.94 05/27/2017 1035    Ref. Range 07/17/2018 09:40  Vitamin D, 25-Hydroxy Latest Ref Range: 30 - 100 ng/mL 27 (L)      OBESITY BEHAVIORAL INTERVENTION  VISIT  Today's visit was # 9   Starting weight: 195 lbs Starting date: 09/14/2018 Today's weight :: 185 lbs Today's date: 01/26/2019 Total lbs lost to date: 10   ASK: We discussed the diagnosis of obesity with Jacobo Forest today and Carsen agreed to give [place] permission to discuss obesity behavioral modification therapy today.  ASSESS: Ardenia has the diagnosis of obesity and her BMI today is 29.87 Joyous is in the action stage of change   ADVISE: Chelli was educated on the multiple health risks of obesity as well as the benefit of weight loss to improve her health. She was advised of the need for long term treatment and the importance of lifestyle modifications to improve her current health and to decrease her risk of future health problems.  AGREE: Multiple dietary modification options and treatment options were discussed and  Kalla agreed to follow the recommendations documented in the above note.  ARRANGE: Beyonce was educated on the importance of frequent visits to treat obesity as outlined per CMS and USPSTF guidelines and agreed to schedule her next follow up appointment today.  I, Tammy Wysor, am acting as Location manager for [employer] I, Abby Potash, PA-C have reviewed above note and agree with its content

## 2019-02-10 ENCOUNTER — Encounter (INDEPENDENT_AMBULATORY_CARE_PROVIDER_SITE_OTHER): Payer: Self-pay

## 2019-02-10 ENCOUNTER — Ambulatory Visit (INDEPENDENT_AMBULATORY_CARE_PROVIDER_SITE_OTHER): Payer: 59 | Admitting: Physician Assistant

## 2020-02-25 ENCOUNTER — Other Ambulatory Visit: Payer: Self-pay | Admitting: Internal Medicine

## 2020-02-25 DIAGNOSIS — Z1231 Encounter for screening mammogram for malignant neoplasm of breast: Secondary | ICD-10-CM

## 2020-03-14 ENCOUNTER — Telehealth: Payer: Self-pay | Admitting: Internal Medicine

## 2020-03-14 ENCOUNTER — Ambulatory Visit (INDEPENDENT_AMBULATORY_CARE_PROVIDER_SITE_OTHER): Payer: 59 | Admitting: Internal Medicine

## 2020-03-14 ENCOUNTER — Encounter: Payer: Self-pay | Admitting: Internal Medicine

## 2020-03-14 VITALS — Ht 66.0 in

## 2020-03-14 DIAGNOSIS — J309 Allergic rhinitis, unspecified: Secondary | ICD-10-CM | POA: Diagnosis not present

## 2020-03-14 DIAGNOSIS — U071 COVID-19: Secondary | ICD-10-CM | POA: Diagnosis not present

## 2020-03-14 DIAGNOSIS — Z5329 Procedure and treatment not carried out because of patient's decision for other reasons: Secondary | ICD-10-CM

## 2020-03-14 DIAGNOSIS — Z20822 Contact with and (suspected) exposure to covid-19: Secondary | ICD-10-CM | POA: Diagnosis not present

## 2020-03-14 DIAGNOSIS — J019 Acute sinusitis, unspecified: Secondary | ICD-10-CM | POA: Diagnosis not present

## 2020-03-14 NOTE — Telephone Encounter (Signed)
Cheryl Watson [phone]  Otila Kluver called to say on Saturday she thinks she has bronchitis, she started having productive cough, shortness of breath with exertions, head congestion, no fever. She has weekly rapid COVID test at work and this morning it was negative.

## 2020-03-14 NOTE — Progress Notes (Signed)
   Subjective:    Patient ID: Cheryl Watson, female    DOB: [birth year], 49 y.o.   MRN: [id number]  HPI Did not keep appt- no show    Review of Systems     Objective:   Physical Exam        Assessment & Plan:

## 2020-03-14 NOTE — Telephone Encounter (Signed)
See today

## 2020-03-14 NOTE — Telephone Encounter (Signed)
Scheduled appointment

## 2020-03-18 NOTE — Progress Notes (Signed)
Patient did not keep appt.

## 2020-03-22 ENCOUNTER — Ambulatory Visit: Payer: Self-pay

## 2020-03-30 ENCOUNTER — Ambulatory Visit
Admission: RE | Admit: 2020-03-30 | Discharge: 2020-03-30 | Disposition: A | Discharge status: Home / Self Care | Payer: 59 | Source: Ambulatory Visit | Attending: Internal Medicine | Admitting: Internal Medicine

## 2020-03-30 ENCOUNTER — Other Ambulatory Visit: Payer: Self-pay

## 2020-03-30 DIAGNOSIS — Z1231 Encounter for screening mammogram for malignant neoplasm of breast: Secondary | ICD-10-CM | POA: Diagnosis not present

## 2020-04-14 ENCOUNTER — Other Ambulatory Visit: Payer: 59 | Admitting: Internal Medicine

## 2020-04-14 ENCOUNTER — Other Ambulatory Visit: Payer: Self-pay

## 2020-04-14 DIAGNOSIS — Z0000 Encounter for general adult medical examination without abnormal findings: Secondary | ICD-10-CM

## 2020-04-14 DIAGNOSIS — E669 Obesity, unspecified: Secondary | ICD-10-CM

## 2020-04-14 DIAGNOSIS — G5603 Carpal tunnel syndrome, bilateral upper limbs: Secondary | ICD-10-CM

## 2020-04-14 DIAGNOSIS — E7800 Pure hypercholesterolemia, unspecified: Secondary | ICD-10-CM | POA: Diagnosis not present

## 2020-04-14 DIAGNOSIS — E7849 Other hyperlipidemia: Secondary | ICD-10-CM

## 2020-04-14 DIAGNOSIS — E559 Vitamin D deficiency, unspecified: Secondary | ICD-10-CM

## 2020-04-14 DIAGNOSIS — Z1329 Encounter for screening for other suspected endocrine disorder: Secondary | ICD-10-CM

## 2020-04-15 LAB — COMPLETE METABOLIC PANEL WITH GFR
AG Ratio: 1.9 (calc) (ref 1.0–2.5)
ALT: 14 U/L (ref 6–29)
AST: 17 U/L (ref 10–35)
Albumin: 4.5 g/dL (ref 3.6–5.1)
Alkaline phosphatase (APISO): 70 U/L (ref 31–125)
BUN: 16 mg/dL (ref 7–25)
CO2: 24 mmol/L (ref 20–32)
Calcium: 9.5 mg/dL (ref 8.6–10.2)
Chloride: 106 mmol/L (ref 98–110)
Creat: 0.61 mg/dL (ref 0.50–1.10)
GFR, Est African American: 123 mL/min/{1.73_m2} (ref >=60)
GFR, Est Non African American: 106 mL/min/{1.73_m2} (ref >=60)
Globulin: 2.4 g/dL (calc) (ref 1.9–3.7)
Glucose, Bld: 105 mg/dL — ABNORMAL HIGH (ref 65–99)
Potassium: 4.9 mmol/L (ref 3.5–5.3)
Sodium: 139 mmol/L (ref 135–146)
Total Bilirubin: 0.4 mg/dL (ref 0.2–1.2)
Total Protein: 6.9 g/dL (ref 6.1–8.1)

## 2020-04-15 LAB — CBC WITH DIFFERENTIAL/PLATELET
Absolute Monocytes: 712 cells/uL (ref 200–950)
Basophils Absolute: 40 cells/uL (ref 0–200)
Basophils Relative: 0.5 %
Eosinophils Absolute: 112 cells/uL (ref 15–500)
Eosinophils Relative: 1.4 %
HCT: 40.8 % (ref 35.0–45.0)
Hemoglobin: 13.3 g/dL (ref 11.7–15.5)
Lymphs Abs: 2544 cells/uL (ref 850–3900)
MCH: 29.7 pg (ref 27.0–33.0)
MCHC: 32.6 g/dL (ref 32.0–36.0)
MCV: 91.1 fL (ref 80.0–100.0)
MPV: 12.5 fL (ref 7.5–12.5)
Monocytes Relative: 8.9 %
Neutro Abs: 4592 cells/uL (ref 1500–7800)
Neutrophils Relative %: 57.4 %
Platelets: 257 10*3/uL (ref 140–400)
RBC: 4.48 10*6/uL (ref 3.80–5.10)
RDW: 12.7 % (ref 11.0–15.0)
Total Lymphocyte: 31.8 %
WBC: 8 10*3/uL (ref 3.8–10.8)

## 2020-04-15 LAB — LIPID PANEL
Cholesterol: 184 mg/dL (ref <200)
HDL: 44 mg/dL — ABNORMAL LOW (ref >=50)
LDL Cholesterol (Calc): 124 mg/dL (calc) — ABNORMAL HIGH
Non-HDL Cholesterol (Calc): 140 mg/dL (calc) — ABNORMAL HIGH (ref <130)
Total CHOL/HDL Ratio: 4.2 (calc) (ref <5.0)
Triglycerides: 68 mg/dL (ref <150)

## 2020-04-15 LAB — VITAMIN D 25 HYDROXY (VIT D DEFICIENCY, FRACTURES): Vit D, 25-Hydroxy: 55 ng/mL (ref 30–100)

## 2020-04-15 LAB — TSH: TSH: 1.26 mIU/L

## 2020-04-17 ENCOUNTER — Other Ambulatory Visit: Payer: Self-pay

## 2020-04-17 ENCOUNTER — Ambulatory Visit (INDEPENDENT_AMBULATORY_CARE_PROVIDER_SITE_OTHER): Payer: 59 | Admitting: Internal Medicine

## 2020-04-17 ENCOUNTER — Other Ambulatory Visit (HOSPITAL_COMMUNITY)
Admission: RE | Admit: 2020-04-17 | Discharge: 2020-04-17 | Disposition: A | Discharge status: Home / Self Care | Payer: 59 | Source: Ambulatory Visit | Attending: Internal Medicine | Admitting: Internal Medicine

## 2020-04-17 ENCOUNTER — Encounter: Payer: Self-pay | Admitting: Internal Medicine

## 2020-04-17 VITALS — BP 100/80 | HR 84 | Temp 98.3°F | Ht 66.0 in | Wt 173.0 lb

## 2020-04-17 DIAGNOSIS — R739 Hyperglycemia, unspecified: Secondary | ICD-10-CM | POA: Diagnosis not present

## 2020-04-17 DIAGNOSIS — Z124 Encounter for screening for malignant neoplasm of cervix: Secondary | ICD-10-CM

## 2020-04-17 DIAGNOSIS — E1169 Type 2 diabetes mellitus with other specified complication: Secondary | ICD-10-CM | POA: Diagnosis not present

## 2020-04-17 DIAGNOSIS — E7800 Pure hypercholesterolemia, unspecified: Secondary | ICD-10-CM

## 2020-04-17 DIAGNOSIS — E785 Hyperlipidemia, unspecified: Secondary | ICD-10-CM

## 2020-04-17 DIAGNOSIS — Z0000 Encounter for general adult medical examination without abnormal findings: Secondary | ICD-10-CM

## 2020-04-17 DIAGNOSIS — Z8639 Personal history of other endocrine, nutritional and metabolic disease: Secondary | ICD-10-CM

## 2020-04-17 DIAGNOSIS — R0989 Other specified symptoms and signs involving the circulatory and respiratory systems: Secondary | ICD-10-CM

## 2020-04-17 DIAGNOSIS — R490 Dysphonia: Secondary | ICD-10-CM

## 2020-04-17 MED ORDER — ERGOCALCIFEROL 1.25 MG (50000 UT) PO CAPS: 50000.0000 [IU] | ORAL_CAPSULE | ORAL | 3 refills | Status: DC

## 2020-04-17 NOTE — Patient Instructions (Signed)
It was a pleasure to see you today.  Please see ENT physician regarding throat issues.  Take vitamin D weekly.  Continue diet and exercise efforts.

## 2020-04-17 NOTE — Progress Notes (Signed)
   Subjective:    Patient ID: Cheryl Watson, female    DOB: [birth year], 49 y.o.   MRN: [id number]  HPI 49 year old Female for health maintenance exam and evaluation of medical issues. She has history of urticaria and angioedema for which EpiPen has been prescribed.  A cholecystectomy 2001.  She is allergic to penicillin and ampicillin.  Half brother died of an overdose 07/09/19.  Stepfather died of apparent MI in 09-08-19.  Father died in January [id number] with complications from pneumonia but not COVID-19.  He was 50 years old and had history of alcoholism, peripheral neuropathy and brain atrophy.  Tetanus immunization up-to-date having been done in 2013.  Social history: She continues to work as a Merchandiser, retail.  She has a Education officer, community.  She is a widow.  Has smoked cigarettes daily for over 30 years.  Rarely drinks alcohol.  Has 3 children.  Husband died of cirrhosis of the liver.  Family history: See above.  Mother living but is estranged from patient and she has not heard from her in some time.  No siblings.    Review of Systems LMP March 31. Menses are heavier with clots.   Vitamin D level is WNL. Taking 2000 units daily.  She would like to return to high dose weekly Vitamin D that Dr. Leafy Ro had her own so we will continue that.  She now participating in a different diet program and has lost 12 pounds since [facility].  She feels well for the most part but has had significant grief over the past several months.  Has had some issues recently with some hoarseness and globus sensation-perhaps laryngeal reflux.  She would like to be checked out by ENT physician and an appointment will be made with [facility], Dr. Constance Holster    Objective:   Physical Exam Blood pressure 100/80 pulse 84 temperature 98.3 degrees pulse oximetry 98% weight 173 pounds BMI 27.92 skin warm and dry.  Nodes none.  TMs are clear.  Neck is supple without thyromegaly.  Chest  clear to auscultation without rales or wheezing.  Abdomen soft nondistended without hepatosplenomegaly masses or tenderness.  Breast without masses.  No lower extremity edema.  Pap taken today.  Bimanual normal.       Assessment & Plan:   Elevated serum glucose of 105-hemoglobin A1c not checked today   Hyperlipidemia-LDL is 124.  HDL low at 44.  Total cholesterol 184 and triglycerides normal at 68.  Follow-up on hyperlipidemia in 6 months.  History of vitamin D deficiency noted to be 23 a year ago and is now 5.  She prefers to take weekly vitamin D supplement.  This was prescribed.  History of obesity-BMI now 27.92.  Continue to work on diet and exercise program.  Significant grief over the past year with loss of 3 relatives.  She is doing remarkably well.  ?  Globus sensation versus GE reflux-to be checked out by ENT physician in the near future.  Plan: She will follow-up in 6 months for hyperlipidemia and elevated serum glucose with lipid panel and hemoglobin A1c.  She will take vitamin D 50,000 units weekly.

## 2020-04-17 NOTE — Progress Notes (Signed)
Per Dr Renold Genta refer to Dr Constance Holster at [facility].

## 2020-04-19 LAB — CYTOLOGY - PAP
Comment: NEGATIVE
Diagnosis: NEGATIVE
High risk HPV: NEGATIVE

## 2020-04-27 DIAGNOSIS — H9312 Tinnitus, left ear: Secondary | ICD-10-CM | POA: Diagnosis not present

## 2020-04-27 DIAGNOSIS — R69 Illness, unspecified: Secondary | ICD-10-CM | POA: Diagnosis not present

## 2020-04-27 DIAGNOSIS — K219 Gastro-esophageal reflux disease without esophagitis: Secondary | ICD-10-CM | POA: Diagnosis not present

## 2020-04-27 DIAGNOSIS — J381 Polyp of vocal cord and larynx: Secondary | ICD-10-CM | POA: Diagnosis not present

## 2020-04-27 DIAGNOSIS — H93292 Other abnormal auditory perceptions, left ear: Secondary | ICD-10-CM | POA: Diagnosis not present

## 2020-05-14 ENCOUNTER — Telehealth: Payer: Self-pay | Admitting: Internal Medicine

## 2020-05-14 MED ORDER — ALPRAZOLAM 0.5 MG PO TABS: 0.5000 mg | ORAL_TABLET | Freq: Every evening | ORAL | 0 refills | Status: DC | PRN

## 2020-05-14 NOTE — Telephone Encounter (Signed)
Refill Xanax 0.5 mg #60 one po twice daily as needed. MJB,MD

## 2020-05-15 DIAGNOSIS — H93292 Other abnormal auditory perceptions, left ear: Secondary | ICD-10-CM | POA: Diagnosis not present

## 2020-05-15 DIAGNOSIS — H9312 Tinnitus, left ear: Secondary | ICD-10-CM | POA: Diagnosis not present

## 2020-05-30 DIAGNOSIS — R69 Illness, unspecified: Secondary | ICD-10-CM | POA: Diagnosis not present

## 2020-05-30 DIAGNOSIS — H9312 Tinnitus, left ear: Secondary | ICD-10-CM | POA: Diagnosis not present

## 2020-05-30 DIAGNOSIS — K219 Gastro-esophageal reflux disease without esophagitis: Secondary | ICD-10-CM | POA: Diagnosis not present

## 2020-05-30 DIAGNOSIS — J381 Polyp of vocal cord and larynx: Secondary | ICD-10-CM | POA: Diagnosis not present

## 2020-06-12 DIAGNOSIS — Z01818 Encounter for other preprocedural examination: Secondary | ICD-10-CM | POA: Diagnosis not present

## 2020-06-16 ENCOUNTER — Other Ambulatory Visit: Payer: Self-pay | Admitting: Otolaryngology

## 2020-06-16 DIAGNOSIS — K219 Gastro-esophageal reflux disease without esophagitis: Secondary | ICD-10-CM | POA: Diagnosis not present

## 2020-06-16 DIAGNOSIS — J381 Polyp of vocal cord and larynx: Secondary | ICD-10-CM | POA: Diagnosis not present

## 2020-07-30 ENCOUNTER — Other Ambulatory Visit: Payer: Self-pay | Admitting: Internal Medicine

## 2020-10-10 ENCOUNTER — Other Ambulatory Visit: Payer: 59 | Admitting: Internal Medicine

## 2020-10-10 ENCOUNTER — Other Ambulatory Visit: Payer: Self-pay

## 2020-10-10 DIAGNOSIS — E785 Hyperlipidemia, unspecified: Secondary | ICD-10-CM

## 2020-10-10 DIAGNOSIS — E1169 Type 2 diabetes mellitus with other specified complication: Secondary | ICD-10-CM

## 2020-10-10 DIAGNOSIS — Z0000 Encounter for general adult medical examination without abnormal findings: Secondary | ICD-10-CM

## 2020-10-10 DIAGNOSIS — E7800 Pure hypercholesterolemia, unspecified: Secondary | ICD-10-CM | POA: Diagnosis not present

## 2020-10-10 DIAGNOSIS — R739 Hyperglycemia, unspecified: Secondary | ICD-10-CM

## 2020-10-11 LAB — LIPID PANEL
Cholesterol: 229 mg/dL — ABNORMAL HIGH (ref <200)
HDL: 74 mg/dL (ref >=50)
LDL Cholesterol (Calc): 139 mg/dL (calc) — ABNORMAL HIGH
Non-HDL Cholesterol (Calc): 155 mg/dL (calc) — ABNORMAL HIGH (ref <130)
Total CHOL/HDL Ratio: 3.1 (calc) (ref <5.0)
Triglycerides: 70 mg/dL (ref <150)

## 2020-10-11 LAB — HEMOGLOBIN A1C
Hgb A1c MFr Bld: 5.3 % of total Hgb (ref <5.7)
Mean Plasma Glucose: 105 (calc)
eAG (mmol/L): 5.8 (calc)

## 2020-10-17 ENCOUNTER — Other Ambulatory Visit: Payer: Self-pay

## 2020-10-17 ENCOUNTER — Encounter: Payer: Self-pay | Admitting: Internal Medicine

## 2020-10-17 ENCOUNTER — Ambulatory Visit: Payer: 59 | Admitting: Internal Medicine

## 2020-10-17 VITALS — BP 110/70 | HR 83 | Ht 66.0 in | Wt 154.0 lb

## 2020-10-17 DIAGNOSIS — E7800 Pure hypercholesterolemia, unspecified: Secondary | ICD-10-CM | POA: Diagnosis not present

## 2020-10-17 DIAGNOSIS — E119 Type 2 diabetes mellitus without complications: Secondary | ICD-10-CM | POA: Diagnosis not present

## 2020-10-17 DIAGNOSIS — Z23 Encounter for immunization: Secondary | ICD-10-CM

## 2020-10-17 DIAGNOSIS — Z8639 Personal history of other endocrine, nutritional and metabolic disease: Secondary | ICD-10-CM | POA: Diagnosis not present

## 2020-10-17 MED ORDER — ROSUVASTATIN CALCIUM 5 MG PO TABS: ORAL_TABLET | ORAL | 2 refills | Status: DC

## 2020-10-17 NOTE — Patient Instructions (Addendum)
Congratulations on weight loss.  You look great.  Continue current regimen and follow-up here in April 2022 for health maintenance exam and fasting labs.  Flu vaccine given today.  Keep up the good work.

## 2020-10-17 NOTE — Progress Notes (Signed)
   Subjective:    Patient ID: Cheryl Watson, female    DOB: [birth year], 49 y.o.   MRN: [id number]  HPI 49 year old Female seen for 6 month followup.  She is on [employer] plan and looks great.  She has lost 19 pounds since April 2021.  Since November 2019 she has lost 34 pounds.  She continues to work as a Merchandiser, retail.  Continues to take high-dose vitamin D 50,000 units weekly.  History of impaired glucose tolerance, currently managed with diet. History of elevated LDL cholesterol.  Has resisted being on statin medication.  However, her LDL is currently 139 and total cholesterol is 229.  In April 2021 total cholesterol was normal at 184 with an LDL of 124.  I have asked her to try Crestor 5 mg three times a week with supper.  She will return in April for health maintenance exam and follow-up of hyperlipidemia.   Review of Systems no new complaints     Objective:   Physical Exam She weighed 173 pounds in April and is now 154 pounds.  BMI is 24.86 currently  Skin warm and dry.  No cervical adenopathy.  No thyromegaly.  Chest clear to auscultation without rales or wheezing.  Cardiac exam: Regular rate and rhythm, normal S1 and S2.  No murmurs or gallops present.  No lower extremity pitting edema.  Affect thought and judgment are normal.         Assessment & Plan:  Successful weight loss with [employer]iva diet program having lost 19 pounds since April 2021  History of impaired glucose tolerance  Pure hypercholesterolemia-started Crestor 5 mg three times weekly with follow-up spring 2022.  History of vitamin D deficiency treated with weekly high-dose vitamin D  Plan: Hemoglobin A1c is stable and within normal limits.  Total cholesterol and LDL elevated and is starting Crestor 5 mg three times a week at supper.  Continue diet and exercise regimen and follow-up.  April 2022 for health maintenance exam and fasting labs.

## 2020-10-18 LAB — MICROALBUMIN / CREATININE URINE RATIO
Creatinine, Urine: 17 mg/dL — ABNORMAL LOW (ref 20–275)
Microalb, Ur: <0.2 mg/dL

## 2020-10-31 ENCOUNTER — Ambulatory Visit (INDEPENDENT_AMBULATORY_CARE_PROVIDER_SITE_OTHER): Payer: Self-pay | Admitting: Plastic Surgery

## 2020-10-31 ENCOUNTER — Other Ambulatory Visit: Payer: Self-pay

## 2020-10-31 ENCOUNTER — Encounter: Payer: Self-pay | Admitting: Plastic Surgery

## 2020-10-31 DIAGNOSIS — Z719 Counseling, unspecified: Secondary | ICD-10-CM

## 2020-10-31 NOTE — Progress Notes (Signed)
Patient ID: Cheryl Watson, female    DOB: 09-14-1971, 49 y.o.   MRN: [id number]   Chief Complaint  Patient presents with  . Advice Only    The patient is a 49 year old white female here for a consultation for her abdomen and breasts.  She is 5 feet 6 inches tall and weighs 157 pounds.  Her preoperative bra is a 36 DD with heavy padding and lifting but likely should be a 36 C.  She is interested in a breast lift and possible augmentation.  She is also interested in an abdominoplasty.  She does not have any history of breast cancer in herself or in her family.  She does not have any diabetes.  She had 2 C-sections with 3 pregnancies.  She is a smoker.  She had a cholecystectomy and carpal tunnel syndrome she is otherwise very healthy.  She has no abdominal hernias noted.  Her mammogram was April 2021 and was negative.   Review of Systems  Constitutional: Negative.  Negative for activity change and appetite change.  HENT: Negative.   Eyes: Negative.   Respiratory: Negative for chest tightness and shortness of breath.   Cardiovascular: Negative for leg swelling.  Gastrointestinal: Negative for abdominal distention and abdominal pain.  Endocrine: Negative.   Genitourinary: Negative.   Musculoskeletal: Negative.   Neurological: Negative.   Hematological: Negative.   Psychiatric/Behavioral: Negative.     Past Medical History:  Diagnosis Date  . Back pain   . Constipation   . Joint pain   . Palpitation   . Shortness of breath   . Vitamin D deficiency     Past Surgical History:  Procedure Laterality Date  . CESAREAN SECTION    . CHOLECYSTECTOMY    . TUBAL LIGATION        Current Outpatient Medications:  .  ALPRAZolam (XANAX) 0.5 MG tablet, TAKE 1 TABLET BY MOUTH AT BEDTIME AS NEEDED FOR ANXIETY., Disp: 90 tablet, Rfl: 1 .  aspirin EC 81 MG tablet, Take 1 tablet by mouth daily., Disp: , Rfl:  .  cholecalciferol (VITAMIN D3) 25 MCG (1000 UNIT) tablet, Take 2,000 Units by  mouth daily., Disp: , Rfl:  .  ergocalciferol (DRISDOL) 1.25 MG (50000 UT) capsule, Take 1 capsule (50,000 Units total) by mouth once a week., Disp: 12 capsule, Rfl: 3 .  rosuvastatin (CRESTOR) 5 MG tablet, One po at supper 3 times a week., Disp: 36 tablet, Rfl: 2   Objective:   Vitals:   10/31/20 0800  BP: 136/87  Pulse: 84  Temp: 98.6 F (37 C)  SpO2: 98%    Physical Exam Vitals and nursing note reviewed.  Constitutional:      Appearance: Normal appearance.  HENT:     Head: Normocephalic and atraumatic.  Cardiovascular:     Rate and Rhythm: Normal rate.     Pulses: Normal pulses.  Pulmonary:     Effort: Pulmonary effort is normal. No respiratory distress.  Abdominal:     General: Abdomen is flat. There is no distension.     Palpations: There is no mass.     Tenderness: There is no abdominal tenderness.  Skin:    General: Skin is warm.     Capillary Refill: Capillary refill takes less than 2 seconds.     Coloration: Skin is not jaundiced.  Neurological:     General: No focal deficit present.     Mental Status: She is alert and oriented to person, place, and  time.  Psychiatric:        Mood and Affect: Mood normal.        Behavior: Behavior normal.        Thought Content: Thought content normal.     Assessment & Plan:  Encounter for counseling   The patient is a very good candidate for breast mastopexy with or without implants.  An abdominoplasty with liposuction would also do well for her.  She will need to be 3 months tobacco free prior to elective surgery.  We will need to get the results of her mammogram.  We will provide her with a quote.  She knows to call us when she has stopped smoking.  Pictures were obtained of the patient and placed in the chart with the patient's or guardian's permission.   [place], DO

## 2021-01-11 DIAGNOSIS — Z20822 Contact with and (suspected) exposure to covid-19: Secondary | ICD-10-CM | POA: Diagnosis not present

## 2021-02-04 ENCOUNTER — Other Ambulatory Visit: Payer: Self-pay | Admitting: Internal Medicine

## 2021-02-09 ENCOUNTER — Ambulatory Visit: Payer: 59 | Admitting: Obstetrics and Gynecology

## 2021-04-04 ENCOUNTER — Ambulatory Visit: Payer: 59 | Admitting: Obstetrics and Gynecology

## 2021-04-04 ENCOUNTER — Encounter: Payer: Self-pay | Admitting: Obstetrics and Gynecology

## 2021-04-04 ENCOUNTER — Other Ambulatory Visit: Payer: Self-pay

## 2021-04-04 VITALS — BP 110/66 | HR 77 | Ht 66.0 in | Wt 159.0 lb

## 2021-04-04 DIAGNOSIS — R5383 Other fatigue: Secondary | ICD-10-CM

## 2021-04-04 DIAGNOSIS — N951 Menopausal and female climacteric states: Secondary | ICD-10-CM

## 2021-04-04 DIAGNOSIS — L659 Nonscarring hair loss, unspecified: Secondary | ICD-10-CM

## 2021-04-04 DIAGNOSIS — N912 Amenorrhea, unspecified: Secondary | ICD-10-CM

## 2021-04-04 DIAGNOSIS — N939 Abnormal uterine and vaginal bleeding, unspecified: Secondary | ICD-10-CM

## 2021-04-04 MED ORDER — MEDROXYPROGESTERONE ACETATE 5 MG PO TABS: ORAL_TABLET | ORAL | 1 refills | Status: DC

## 2021-04-04 NOTE — Patient Instructions (Signed)
You can try estroven or estroven pm for hot flashes and night sweats  Menopause Menopause is the normal time of a woman's life when menstrual periods stop completely. It marks the natural end to a woman's ability to become pregnant. It can be defined as the absence of a menstrual period for 12 months without another medical cause. The transition to menopause (perimenopause) most often happens between the ages of 12 and 25, and can last for many years. During perimenopause, hormone levels change in your body, which can cause symptoms and affect your health. Menopause may increase your risk for:  Weakened bones (osteoporosis), which causes fractures.  Depression.  Hardening and narrowing of the arteries (atherosclerosis), which can cause heart attacks and strokes. What are the causes? This condition is usually caused by a natural change in hormone levels that happens as you get older. The condition may also be caused by changes that are not natural, including:  Surgery to remove both ovaries (surgical menopause).  Side effects from some medicines, such as chemotherapy used to treat cancer (chemical menopause). What increases the risk? This condition is more likely to start at an earlier age if you have certain medical conditions or have undergone treatments, including:  A tumor of the pituitary gland in the brain.  A disease that affects the ovaries and hormones.  Certain cancer treatments, such as chemotherapy or hormone therapy, or radiation therapy on the pelvis.  Heavy smoking and excessive alcohol use.  Family history of early menopause. This condition is also more likely to develop earlier in women who are very thin. What are the signs or symptoms? Symptoms of this condition include:  Hot flashes.  Irregular menstrual periods.  Night sweats.  Changes in feelings about sex. This could be a decrease in sex drive or an increased discomfort around your sexuality.  Vaginal  dryness and thinning of the vaginal walls. This may cause painful sex.  Dryness of the skin and development of wrinkles.  Headaches.  Problems sleeping (insomnia).  Mood swings or irritability.  Memory problems.  Weight gain.  Hair growth on the face and chest.  Bladder infections or problems with urinating. How is this diagnosed? This condition is diagnosed based on your medical history, a physical exam, your age, your menstrual history, and your symptoms. Hormone tests may also be done. How is this treated? In some cases, no treatment is needed. You and your health care provider should make a decision together about whether treatment is necessary. Treatment will be based on your individual condition and preferences. Treatment for this condition focuses on managing symptoms. Treatment may include:  Menopausal hormone therapy (MHT).  Medicines to treat specific symptoms or complications.  Acupuncture.  Vitamin or herbal supplements. Before starting treatment, make sure to let your health care provider know if you have a personal or family history of these conditions:  Heart disease.  Breast cancer.  Blood clots.  Diabetes.  Osteoporosis. Follow these instructions at home: Lifestyle  Do not use any products that contain nicotine or tobacco, such as cigarettes, e-cigarettes, and chewing tobacco. If you need help quitting, ask your health care provider.  Get at least 30 minutes of physical activity on 5 or more days each week.  Avoid alcoholic and caffeinated beverages, as well as spicy foods. This may help prevent hot flashes.  Get 7-8 hours of sleep each night.  If you have hot flashes, try: ? Dressing in layers. ? Avoiding things that may trigger hot flashes, such  as spicy food, warm places, or stress. ? Taking slow, deep breaths when a hot flash starts. ? Keeping a fan in your home and office.  Find ways to manage stress, such as deep breathing, meditation,  or journaling.  Consider going to group therapy with other women who are having menopause symptoms. Ask your health care provider about recommended group therapy meetings. Eating and drinking  Eat a healthy, balanced diet that contains whole grains, lean protein, low-fat dairy, and plenty of fruits and vegetables.  Your health care provider may recommend adding more soy to your diet. Foods that contain soy include tofu, tempeh, and soy milk.  Eat plenty of foods that contain calcium and vitamin D for bone health. Items that are rich in calcium include low-fat milk, yogurt, beans, almonds, sardines, broccoli, and kale.   Medicines  Take over-the-counter and prescription medicines only as told by your health care provider.  Talk with your health care provider before starting any herbal supplements. If prescribed, take vitamins and supplements as told by your health care provider. General instructions  Keep track of your menstrual periods, including: ? When they occur. ? How heavy they are and how long they last. ? How much time passes between periods.  Keep track of your symptoms, noting when they start, how often you have them, and how long they last.  Use vaginal lubricants or moisturizers to help with vaginal dryness and improve comfort during sex.  Keep all follow-up visits. This is important. This includes any group therapy or counseling.   Contact a health care provider if:  You are still having menstrual periods after age 24.  You have pain during sex.  You have not had a period for 12 months and you develop vaginal bleeding. Get help right away if you have:  Severe depression.  Excessive vaginal bleeding.  Pain when you urinate.  A fast or irregular heartbeat (palpitations).  Severe headaches.  Abdominal pain or severe indigestion. Summary  Menopause is a normal time of life when menstrual periods stop completely. It is usually defined as the absence of a  menstrual period for 12 months without another medical cause.  The transition to menopause (perimenopause) most often happens between the ages of 15 and 72 and can last for several years.  Symptoms can be managed through medicines, lifestyle changes, and complementary therapies such as acupuncture.  Eat a balanced diet that is rich in nutrients to promote bone health and heart health and to manage symptoms during menopause. This information is not intended to replace advice given to you by your health care provider. Make sure you discuss any questions you have with your health care provider. Document Revised: 09/15/2020 Document Reviewed: 06/01/2020 Elsevier Patient Education  [place].

## 2021-04-04 NOTE — Progress Notes (Signed)
50 y.o. [id number] Engaged White or Caucasian Not Hispanic or Latino female here for a consultation from Dr Renold Genta for amenorrhea and hot flashes.  Patient states that she had a period in September then did not have another one until March.    Prior to 9/21 her cycles were monthly x 5 days. Could saturate a super tampon in 2 hours. Tolerable cramps. No BTB. Last cycle started on 09/15/20, normal cycle for her. No bleeding until 03/05/21. She bleed heavily x 3 days, changed a super tampon in 2 hours. Bleed for a total of 7 days. Cramps were severe x 4 days. Night sweats started last summer. In October she started having hot flashes. By January or February her vasomotor symptoms improved some. Currently symptoms are tolerable.  Sexually active, no dyspareunia.  Just started having breast tenderness yesterday, having mood changes, feels premenstrual.  No galactorrhea.  She does have fatigue, hair loss and dry skin.  Period Duration (Days): 4-5 Period Pattern: (!) Irregular Menstrual Flow: Heavy Menstrual Control: Maxi pad Menstrual Control Change Freq (Hours): 2 Dysmenorrhea Symptoms: Cramping  Patient's last menstrual period was 03/05/2021.          Sexually active: Yes.    The current method of family planning is tubal ligation.    Exercising: No.  The patient does not participate in regular exercise at present. Smoker:  Yes, <1PPD does want to quit.   Health Maintenance: Pap:  04/17/20 WNL Hr HPV Neg  History of abnormal Pap:  no MMG:  03/30/20 Density B Bi-rads 1 neg  BMD:   None  Colonoscopy: none, will schedule with Dr Renold Genta  TDaP:  2013 Gardasil: none    reports that she has been smoking. She quit smokeless tobacco use about 4 years ago. She reports that she does not drink alcohol and does not use drugs. 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 Daughter is 22, in [place].   Past Medical History:  Diagnosis Date  . Back pain   . Constipation   . Joint pain   . Palpitation   .  Shortness of breath   . Vitamin D deficiency     Past Surgical History:  Procedure Laterality Date  . CESAREAN SECTION    . CHOLECYSTECTOMY    . TUBAL LIGATION    C/S x 2  Current Outpatient Medications  Medication Sig Dispense Refill  . ALPRAZolam (XANAX) 0.5 MG tablet TAKE 1 TABLET BY MOUTH AT BEDTIME AS NEEDED FOR ANXIETY 39 tablet 2  . aspirin EC 81 MG tablet Take 1 tablet by mouth daily.    . ergocalciferol (DRISDOL) 1.25 MG (50000 UT) capsule Take 1 capsule (50,000 Units total) by mouth once a week. 12 capsule 3  . rosuvastatin (CRESTOR) 5 MG tablet One po at supper 3 times a week. 36 tablet 2   No current facility-administered medications for this visit.    Family History  Problem Relation Age of Onset  . Depression Mother   . Obesity Mother   . Neuropathy Father   . Alcohol abuse Father   . High blood pressure Father   . High Cholesterol Father   . Depression Father   . Sleep apnea Father   . Drug abuse Father   . Breast cancer Paternal Aunt     Review of Systems  Gastrointestinal: Positive for abdominal pain.  Genitourinary: Positive for menstrual problem and vaginal bleeding.  Musculoskeletal: Positive for back pain.  All other systems reviewed and are negative.  She c/o a 2 month h/o intermittent cramping in her LLQ (lateral). She is having diarrhea, having 2-4 BM's a day. She has gone from constipation to diarrhea all of her life.   Exam:   BP 110/66   Pulse 77   Ht 5\' 6"  (1.676 m)   Wt 159 lb (72.1 kg)   LMP 03/05/2021   SpO2 97%   BMI 25.66 kg/m   Weight change: @WEIGHTCHANGE @ Height:   Height: 5\' 6"  (167.6 cm)  Ht Readings from Last 3 Encounters:  04/04/21 5\' 6"  (1.676 m)  10/31/20 5\' 6"  (1.676 m)  10/17/20 5\' 6"  (1.676 m)    General appearance: alert, cooperative and appears stated age Head: Normocephalic, without obvious abnormality, atraumatic Neck: no adenopathy, supple, symmetrical, trachea midline and thyroid normal to inspection and  palpation Lungs: clear to auscultation bilaterally Cardiovascular: regular rate and rhythm Breasts: normal appearance, no masses or tenderness Abdomen: soft, non-tender; non distended,  no masses,  no organomegaly Extremities: extremities normal, atraumatic, no cyanosis or edema Skin: Skin color, texture, turgor normal. No rashes or lesions Lymph nodes: Cervical, supraclavicular, and axillary nodes normal. No abnormal inguinal nodes palpated Neurologic: Grossly normal   Pelvic: External genitalia:  no lesions              Urethra:  normal appearing urethra with no masses, tenderness or lesions              Bartholins and Skenes: normal                 Vagina: normal appearing vagina with normal color and discharge, no lesions              Cervix: no lesions               Bimanual Exam:  Uterus:  normal size, contour, position, consistency, mobility, non-tender and anteverted              Adnexa: no mass, fullness, tenderness               Rectovaginal: Confirms               Anus:  normal sphincter tone, no lesions  Gae Dry chaperoned for the exam.  1. Secondary amenorrhea Symptoms c/w perimenopause. She also has some thyroid symptoms - TSH - Follicle stimulating hormone - medroxyPROGESTERone (PROVERA) 5 MG tablet; Take one tablet a day for 5 days every other month if no spontaneous menses.  Dispense: 15 tablet; Refill: 1  2. Other fatigue - TSH  3. Hair loss - TSH  4. Abnormal uterine bleeding (AUB) Episode of heavy bleeding. Normal exam, c/w perimenopause - CBC - Ferritin - medroxyPROGESTERone (PROVERA) 5 MG tablet; Take one tablet a day for 5 days every other month if no spontaneous menses.  Dispense: 15 tablet; Refill: 1  5. Perimenopausal vasomotor symptoms, currently tolerable Discussed avoiding triggers Discussed behavioral changes Discussed OTC herbal products Return if symptoms aren't controlled  CC: Dr Renold Genta Note sent

## 2021-04-05 LAB — CBC
HCT: 39.5 % (ref 35.0–45.0)
Hemoglobin: 13.2 g/dL (ref 11.7–15.5)
MCH: 31.3 pg (ref 27.0–33.0)
MCHC: 33.4 g/dL (ref 32.0–36.0)
MCV: 93.6 fL (ref 80.0–100.0)
MPV: 11 fL (ref 7.5–12.5)
Platelets: 254 10*3/uL (ref 140–400)
RBC: 4.22 10*6/uL (ref 3.80–5.10)
RDW: 12.1 % (ref 11.0–15.0)
WBC: 8.9 10*3/uL (ref 3.8–10.8)

## 2021-04-05 LAB — TSH: TSH: 1.12 mIU/L

## 2021-04-05 LAB — FOLLICLE STIMULATING HORMONE: FSH: 24.8 m[IU]/mL

## 2021-04-05 LAB — FERRITIN: Ferritin: 27 ng/mL (ref 16–232)

## 2021-04-17 ENCOUNTER — Other Ambulatory Visit: Payer: Managed Care, Other (non HMO) | Admitting: Internal Medicine

## 2021-04-17 DIAGNOSIS — E1169 Type 2 diabetes mellitus with other specified complication: Secondary | ICD-10-CM

## 2021-04-17 DIAGNOSIS — Z8639 Personal history of other endocrine, nutritional and metabolic disease: Secondary | ICD-10-CM

## 2021-04-17 DIAGNOSIS — Z0000 Encounter for general adult medical examination without abnormal findings: Secondary | ICD-10-CM

## 2021-04-17 DIAGNOSIS — E7800 Pure hypercholesterolemia, unspecified: Secondary | ICD-10-CM

## 2021-04-17 DIAGNOSIS — E119 Type 2 diabetes mellitus without complications: Secondary | ICD-10-CM

## 2021-04-19 ENCOUNTER — Other Ambulatory Visit: Payer: Managed Care, Other (non HMO) | Admitting: Internal Medicine

## 2021-04-19 ENCOUNTER — Other Ambulatory Visit: Payer: Self-pay

## 2021-04-20 ENCOUNTER — Encounter: Payer: 59 | Admitting: Internal Medicine

## 2021-04-20 LAB — COMPLETE METABOLIC PANEL WITH GFR
AG Ratio: 2.1 (calc) (ref 1.0–2.5)
ALT: 14 U/L (ref 6–29)
AST: 14 U/L (ref 10–35)
Albumin: 4.2 g/dL (ref 3.6–5.1)
Alkaline phosphatase (APISO): 56 U/L (ref 37–153)
BUN: 11 mg/dL (ref 7–25)
CO2: 27 mmol/L (ref 20–32)
Calcium: 9 mg/dL (ref 8.6–10.4)
Chloride: 107 mmol/L (ref 98–110)
Creat: 0.62 mg/dL (ref 0.50–1.05)
GFR, Est African American: 122 mL/min/{1.73_m2} (ref >=60)
GFR, Est Non African American: 105 mL/min/{1.73_m2} (ref >=60)
Globulin: 2 g/dL (calc) (ref 1.9–3.7)
Glucose, Bld: 89 mg/dL (ref 65–99)
Potassium: 4.8 mmol/L (ref 3.5–5.3)
Sodium: 140 mmol/L (ref 135–146)
Total Bilirubin: 0.5 mg/dL (ref 0.2–1.2)
Total Protein: 6.2 g/dL (ref 6.1–8.1)

## 2021-04-20 LAB — HEMOGLOBIN A1C
Hgb A1c MFr Bld: 5.1 % of total Hgb (ref <5.7)
Mean Plasma Glucose: 100 mg/dL
eAG (mmol/L): 5.5 mmol/L

## 2021-04-20 LAB — CBC WITH DIFFERENTIAL/PLATELET
Absolute Monocytes: 637 cells/uL (ref 200–950)
Basophils Absolute: 30 cells/uL (ref 0–200)
Basophils Relative: 0.5 %
Eosinophils Absolute: 171 cells/uL (ref 15–500)
Eosinophils Relative: 2.9 %
HCT: 36.7 % (ref 35.0–45.0)
Hemoglobin: 12.2 g/dL (ref 11.7–15.5)
Lymphs Abs: 2513 cells/uL (ref 850–3900)
MCH: 31.7 pg (ref 27.0–33.0)
MCHC: 33.2 g/dL (ref 32.0–36.0)
MCV: 95.3 fL (ref 80.0–100.0)
MPV: 11.3 fL (ref 7.5–12.5)
Monocytes Relative: 10.8 %
Neutro Abs: 2549 cells/uL (ref 1500–7800)
Neutrophils Relative %: 43.2 %
Platelets: 244 10*3/uL (ref 140–400)
RBC: 3.85 10*6/uL (ref 3.80–5.10)
RDW: 12.2 % (ref 11.0–15.0)
Total Lymphocyte: 42.6 %
WBC: 5.9 10*3/uL (ref 3.8–10.8)

## 2021-04-20 LAB — TSH: TSH: 1.95 mIU/L

## 2021-04-20 LAB — LIPID PANEL
Cholesterol: 202 mg/dL — ABNORMAL HIGH (ref <200)
HDL: 64 mg/dL (ref >=50)
LDL Cholesterol (Calc): 122 mg/dL (calc) — ABNORMAL HIGH
Non-HDL Cholesterol (Calc): 138 mg/dL (calc) — ABNORMAL HIGH (ref <130)
Total CHOL/HDL Ratio: 3.2 (calc) (ref <5.0)
Triglycerides: 69 mg/dL (ref <150)

## 2021-04-20 LAB — VITAMIN D 25 HYDROXY (VIT D DEFICIENCY, FRACTURES): Vit D, 25-Hydroxy: 67 ng/mL (ref 30–100)

## 2021-04-27 ENCOUNTER — Encounter: Payer: Self-pay | Admitting: Internal Medicine

## 2021-04-27 ENCOUNTER — Other Ambulatory Visit: Payer: Self-pay

## 2021-04-27 ENCOUNTER — Ambulatory Visit (INDEPENDENT_AMBULATORY_CARE_PROVIDER_SITE_OTHER): Payer: Managed Care, Other (non HMO) | Admitting: Internal Medicine

## 2021-04-27 VITALS — BP 110/80 | HR 86 | Ht 66.0 in | Wt 162.0 lb

## 2021-04-27 DIAGNOSIS — Z8639 Personal history of other endocrine, nutritional and metabolic disease: Secondary | ICD-10-CM | POA: Diagnosis not present

## 2021-04-27 DIAGNOSIS — Z1239 Encounter for other screening for malignant neoplasm of breast: Secondary | ICD-10-CM

## 2021-04-27 DIAGNOSIS — Z0000 Encounter for general adult medical examination without abnormal findings: Secondary | ICD-10-CM | POA: Diagnosis not present

## 2021-04-27 DIAGNOSIS — E7800 Pure hypercholesterolemia, unspecified: Secondary | ICD-10-CM

## 2021-04-27 DIAGNOSIS — N951 Menopausal and female climacteric states: Secondary | ICD-10-CM | POA: Diagnosis not present

## 2021-04-27 LAB — POCT URINALYSIS DIPSTICK
Appearance: NEGATIVE
Bilirubin, UA: NEGATIVE
Blood, UA: NEGATIVE
Glucose, UA: NEGATIVE
Ketones, UA: NEGATIVE
Leukocytes, UA: NEGATIVE
Nitrite, UA: NEGATIVE
Odor: NEGATIVE
Protein, UA: NEGATIVE
Spec Grav, UA: 1.015 (ref 1.010–1.025)
Urobilinogen, UA: 0.2 E.U./dL
pH, UA: 6.5 (ref 5.0–8.0)

## 2021-04-27 MED ORDER — ALPRAZOLAM 0.5 MG PO TABS: ORAL_TABLET | ORAL | 2 refills | Status: DC

## 2021-04-27 NOTE — Progress Notes (Signed)
   Subjective:    Patient ID: Cheryl Watson, female    DOB: February 25, 1971, 50 y.o.   MRN: [id number]  HPI 50 year old Female seen for health maintenance exam and evaluation of medical issues.  Past medical history: History of cholecystectomy 2001.  She is allergic to penicillin and ampicillin.  Tetanus immunization is up-to-date having been done in 2013.  Family history: 57 brother died of an overdose in June 23, 2019.  Stepfather died from apparent MI in 08-23-19.  Father died January 7616 with complications from pneumonia but not COVID-19.  He was 51 years old and had history of alcoholism, peripheral neuropathy and brain atrophy.  Social history: She continues to work as a Merchandiser, retail.  She has a Education officer, community.  She is a widow.  Has smoked cigarettes daily for over 30 years.  Rarely drinks alcohol.  Has 3 children.  Husband died of cirrhosis of the liver.  Family history: Mother living that is estranged from patient and she has not heard from her in some time.  No siblings.  History of hyperlipidemia treated with Crestor 5 mg 3 times a week.  Has not been taking that consistently.  LDL is 122.  Would like for her to take this.  Total cholesterol 202, HDL 64, triglycerides 69.  History of vitamin D deficiency noted to be 27 in 2020.  This is now 45.  Continue high-dose vitamin D weekly.  History of impaired glucose tolerance but hemoglobin A1c is normal at 5.1%.  Order placed for mammography.  Saw Dr. Talbert Nan for evaluation of amenorrhea and was found to be perimenopausal.  History of tubal ligation.  Treated with Provera for perimenopause.  Had issues with episode of heavy bleeding prompting referral to Dr. Talbert Nan.  Patient is to take 1 tablet a day for 5 days every other month if no spontaneous menses.  Has not had screening colonoscopy.  Order will be placed.  Review of Systems no new complaints     Objective:   Physical Exam Blood pressure 110/80 pulse 86 regular  pulse oximetry 98% weight 162 pounds height 5 feet 6 inches BMI 26.15  Skin: Warm and dry.  Nodes none.  TMs are clear.  Neck supple.  No thyromegaly.  No adenopathy or carotid bruits.  Chest clear to auscultation.  Breasts: Fibrocystic changes.  Mammogram ordered.  Abdomen soft nondistended without hepatosplenomegaly masses or tenderness.  Cardiac exam: Regular rate and rhythm without murmurs.  No lower extremity pitting edema.  GYN deferred to Dr. Talbert Nan.  Neuro intact without focal deficits.       Assessment & Plan:  Perimenopause seen by Dr. Talbert Nan and treated with [place] for colonoscopy  History of obesity-working on diet and exercise  History of vitamin D deficiency-continue with high-dose vitamin D weekly  Hyperlipidemia-continue with Crestor 5 mg 3 times a week  History of impaired glucose tolerance-last year fasting glucose was 105 and in 2018 it was 116.  Glucose is normal this year and hemoglobin A1c stable at 5.1%.  Fibrocystic breast disease-due for mammogram-order placed  Health maintenance-tetanus immunization due in 2023.  No COVID-19 vaccines on file  Plan: Refill vitamin D 50,000 units weekly.  Order placed for mammogram and colonoscopy.  Return in November for lipid panel and liver functions as she will be restarting Crestor 5 mg 3 times weekly.  Continue high-dose vitamin D.

## 2021-04-27 NOTE — Patient Instructions (Signed)
Restart Crestor 5 mg 3 times a week and follow-up in 6 months for lipid panel and liver functions.  Referral made for colonoscopy.  Order placed for mammogram.  It was a pleasure to see you today.  Continue high-dose vitamin D supplementation weekly.

## 2021-05-29 ENCOUNTER — Other Ambulatory Visit: Payer: Self-pay | Admitting: Internal Medicine

## 2021-05-29 DIAGNOSIS — Z1231 Encounter for screening mammogram for malignant neoplasm of breast: Secondary | ICD-10-CM

## 2021-07-11 ENCOUNTER — Other Ambulatory Visit: Payer: Self-pay | Admitting: Internal Medicine

## 2021-07-19 ENCOUNTER — Ambulatory Visit
Admission: RE | Admit: 2021-07-19 | Discharge: 2021-07-19 | Disposition: A | Discharge status: Home / Self Care | Payer: Managed Care, Other (non HMO) | Source: Ambulatory Visit

## 2021-07-19 ENCOUNTER — Other Ambulatory Visit: Payer: Self-pay

## 2021-07-19 DIAGNOSIS — Z1231 Encounter for screening mammogram for malignant neoplasm of breast: Secondary | ICD-10-CM

## 2021-07-31 ENCOUNTER — Encounter: Payer: Self-pay | Admitting: Gastroenterology

## 2021-09-05 ENCOUNTER — Encounter: Payer: Self-pay | Admitting: Gastroenterology

## 2021-09-05 ENCOUNTER — Ambulatory Visit: Payer: Managed Care, Other (non HMO) | Admitting: Gastroenterology

## 2021-09-05 VITALS — BP 128/74 | HR 64 | Ht 67.0 in | Wt 161.0 lb

## 2021-09-05 DIAGNOSIS — Z1211 Encounter for screening for malignant neoplasm of colon: Secondary | ICD-10-CM

## 2021-09-05 DIAGNOSIS — R1013 Epigastric pain: Secondary | ICD-10-CM | POA: Diagnosis not present

## 2021-09-05 DIAGNOSIS — K529 Noninfective gastroenteritis and colitis, unspecified: Secondary | ICD-10-CM

## 2021-09-05 MED ORDER — PLENVU 140 G PO SOLR: 140.0000 g | ORAL | 0 refills | Status: DC

## 2021-09-05 NOTE — Patient Instructions (Addendum)
If you are age 51 or older, your body mass index should be between 23-30. Your Body mass index is 25.22 kg/m. If this is out of the aforementioned range listed, please consider follow up with your Primary Care Provider.  If you are age 72 or younger, your body mass index should be between 19-25. Your Body mass index is 25.22 kg/m. If this is out of the aformentioned range listed, please consider follow up with your Primary Care Provider.   __________________________________________________________  The [facility] providers would like to encourage you to use [facility] to communicate with providers for non-urgent requests or questions.  Due to long hold times on the telephone, sending your provider a message by [facility] may be a faster and more efficient way to get a response.  Please allow 48 business hours for a response.  Please remember that this is for non-urgent requests.   You have been scheduled for an endoscopy and colonoscopy. Please follow the written instructions given to you at your visit today. Please pick up your prep supplies at the pharmacy within the next 1-3 days. If you use inhalers (even only as needed), please bring them with you on the day of your procedure.   It was a pleasure to see you today!  Thank you for trusting me with your gastrointestinal care!

## 2021-09-05 NOTE — Progress Notes (Signed)
[facility] Consult Note:  History: Cheryl Watson 09/05/2021  Referring provider: Elby Showers, MD  Reason for consult/chief complaint: Colon Cancer Screening (Patient states she had a colonoscopy 20 years ago and is 50 years old now and recommended to have repeat colon.), Diarrhea (Patient states she was always constipated all her life until the last few years and now she has diarrhea. Never has a formed stool. 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 Cheryl Watson says she tended toward constipation all of her life until about 2 years ago, when she developed loose stool more often than not.  On average 2 semiformed to loose BMs per day without bleeding.  She did not have a surgery, change in medical condition or new medicine that may have been triggers for this change in bowel habits. Also, for about the last 6 to 8 months she has had intermittent epigastric discomfort with no clear triggers or relieving factors or radiation.  Sometimes she will awaken with it and will be there much of the day with associated bloating and sometimes tenderness.  It is not reminiscent of her previous biliary colic.  In about 2001 she had cholecystectomy and an ERCP for CBD stone start her description.  She denies nausea vomiting or dysphagia.  Sometimes she will have left lower quadrant pain that is crampy or bloating, again without clear triggers or relieving factors   ROS:  Review of Systems  Constitutional:  Negative for appetite change and unexpected weight change.  HENT:  Negative for mouth sores and voice change.   Eyes:  Negative for pain and redness.  Respiratory:  Negative for cough and shortness of breath.   Cardiovascular:  Negative for chest pain and  palpitations.  Genitourinary:  Negative for dysuria and hematuria.  Musculoskeletal:  Positive for back pain. Negative for arthralgias and myalgias.  Skin:  Negative for pallor and rash.  Neurological:  Negative for weakness and headaches.  Hematological:  Negative for adenopathy.    Past Medical History: Past Medical History:  Diagnosis Date   Amenorrhea    Anxiety    Back pain    Constipation    Dysmenorrhea    Joint pain    Palpitation    Shortness of breath    Urinary incontinence    Vitamin D deficiency      Past Surgical History: Past Surgical History:  Procedure Laterality Date   CESAREAN SECTION     CHOLECYSTECTOMY     TUBAL LIGATION       Family History: Family History  Problem Relation Age of Onset   Depression Mother    Obesity Mother    Neuropathy Father    Alcohol abuse Father    High blood pressure Father    High Cholesterol Father    Depression Father    Sleep apnea Father    Drug abuse Father    Breast cancer Paternal Aunt 30   Colon cancer Paternal Uncle     Social History: Social History   Socioeconomic History   Marital status: Widowed    Spouse name: Not on file   Number of children: Not on file   Years of education: Not on file   Highest education level: Not on file  Occupational History  Occupation: hospice nurse, pt bookeeper  Tobacco Use   Smoking status: Every Day   Smokeless tobacco: Former    Quit date: 06/07/2016   Tobacco comments:    1/4 pack   Vaping Use   Vaping Use: Never used  Substance and Sexual Activity   Alcohol use: No    Alcohol/week: 0.0 standard drinks   Drug use: No   Sexual activity: Yes    Birth control/protection: Surgical    Comment: INTERCOURSE AGE 50, SEXUAL PARTNRES MORE THAN 5  Other Topics Concern   Not on file  Social History Narrative   Not on file   Social Determinants of Health   Financial Resource Strain: Not on file  Food Insecurity: Not on file  Transportation Needs: Not on  file  Physical Activity: Not on file  Stress: Not on file  Social Connections: Not on file   Smoker, rare alcohol  Allergies: Allergies  Allergen Reactions   Penicillins     All cilin drugs    Outpatient Meds: Current Outpatient Medications  Medication Sig Dispense Refill   ALPRAZolam (XANAX) 0.5 MG tablet TAKE 1 TABLET BY MOUTH AT BEDTIME AS NEEDED FOR ANXIETY 60 tablet 2   aspirin EC 81 MG tablet Take 1 tablet by mouth daily.     medroxyPROGESTERone (PROVERA) 5 MG tablet Take one tablet a day for 5 days every other month if no spontaneous menses. 15 tablet 1   PEG-KCl-NaCl-NaSulf-Na Asc-C (PLENVU) 140 g SOLR Take 140 g by mouth as directed. Manufacturer's coupon Universal coupon code:BIN: C1589615; GROUPSE:2314430; PCN: [id number]; IDCY:3527170; PAY NO MORE $50 1 each 0   rosuvastatin (CRESTOR) 5 MG tablet One po at supper 3 times a week. 36 tablet 2   Vitamin D, Ergocalciferol, (DRISDOL) 1.25 MG (50000 UNIT) CAPS capsule TAKE 1 CAPSULE BY MOUTH ONE TIME PER WEEK 12 capsule 1   No current facility-administered medications for this visit.      ___________________________________________________________________ Objective   Exam:  BP 128/74   Pulse 64   Ht '5\' 7"'$  (1.702 m)   Wt 161 lb (73 kg)   BMI 25.22 kg/m  Wt Readings from Last 3 Encounters:  09/05/21 161 lb (73 kg)  04/27/21 162 lb (73.5 kg)  04/04/21 159 lb (72.1 kg)    General: Well-appearing, pleasant and conversational Eyes: sclera anicteric, no redness ENT: oral mucosa moist without lesions, no cervical or supraclavicular lymphadenopathy.  Good dentition (upper bridge) CV: RRR without murmur, S1/S2, no JVD, no peripheral edema Resp: clear to auscultation bilaterally, normal RR and effort noted GI: soft, no tenderness, with active bowel sounds. No guarding or palpable organomegaly noted. Skin; warm and dry, no rash or jaundice noted Neuro: awake, alert and oriented x 3. Normal gross motor function and fluent  speech  Labs:  CBC Latest Ref Rng & Units 04/19/2021 04/04/2021 04/14/2020  WBC 3.8 - 10.8 Thousand/uL 5.9 8.9 8.0  Hemoglobin 11.7 - 15.5 g/dL 12.2 13.2 13.3  Hematocrit 35.0 - 45.0 % 36.7 39.5 40.8  Platelets 140 - 400 Thousand/uL 244 254 257   CMP Latest Ref Rng & Units 04/19/2021 04/14/2020 07/17/2018  Glucose 65 - 99 mg/dL 89 105(H) 94  BUN 7 - 25 mg/dL '11 16 11  '$ Creatinine 0.50 - 1.05 mg/dL 0.62 0.61 0.65  Sodium 135 - 146 mmol/L 140 139 139  Potassium 3.5 - 5.3 mmol/L 4.8 4.9 4.4  Chloride 98 - 110 mmol/L 107 106 108  CO2 20 - 32 mmol/L 27 24  22  Calcium 8.6 - 10.4 mg/dL 9.0 9.5 9.1  Total Protein 6.1 - 8.1 g/dL 6.2 6.9 6.5  Total Bilirubin 0.2 - 1.2 mg/dL 0.5 0.4 0.5  Alkaline Phos 33 - 115 U/L - - -  AST 10 - 35 U/L '14 17 14  '$ ALT 6 - 29 U/L '14 14 13     '$ Radiologic Studies:  No abdominal imaging reports in Epic  Assessment: Encounter Diagnoses  Name Primary?   Special screening for malignant neoplasms, colon Yes   Epigastric pain    Chronic diarrhea     Change in bowel habits about 2 years ago, no clear explanation.  It happened many years after cholecystectomy, so unlikely bile acid diarrhea.  Short list of medicines, not likely side effect of those.?  Microscopic colitis. Overall looks well, appetite good and weight stable, speaking against malabsorption  Upper abdominal discomfort difficult to characterize, more recent onset in the last 6 to 9 months.  She does not feel it is reminiscent of her biliary colic, and it does not sound biliary such as CBD stone.  Last LFTs normal.  Consider gastritis, ulcer, dyspepsia  Plan:  Screening colonoscopy and diagnostic upper endoscopy.  She was agreeable after discussion of procedure and risks.  The benefits and risks of the planned procedure were described in detail with the patient or (when appropriate) their health care proxy.  Risks were outlined as including, but not limited to, bleeding, infection, perforation, adverse  medication reaction leading to cardiac or pulmonary decompensation, pancreatitis (if ERCP).  The limitation of incomplete mucosal visualization was also discussed.  No guarantees or warranties were given.   Thank you for the courtesy of this consult.  Please call me with any questions or concerns.  Nelida Meuse III  CC: Referring provider noted above

## 2021-10-16 ENCOUNTER — Encounter: Payer: Self-pay | Admitting: Certified Registered Nurse Anesthetist

## 2021-10-17 ENCOUNTER — Telehealth: Payer: Self-pay | Admitting: Gastroenterology

## 2021-10-17 ENCOUNTER — Ambulatory Visit (AMBULATORY_SURGERY_CENTER): Payer: Managed Care, Other (non HMO) | Admitting: Gastroenterology

## 2021-10-17 ENCOUNTER — Encounter: Payer: Self-pay | Admitting: Gastroenterology

## 2021-10-17 VITALS — BP 117/63 | HR 66 | Temp 97.8°F | Resp 25 | Ht 67.0 in | Wt 161.0 lb

## 2021-10-17 DIAGNOSIS — R197 Diarrhea, unspecified: Secondary | ICD-10-CM | POA: Diagnosis present

## 2021-10-17 DIAGNOSIS — Z1211 Encounter for screening for malignant neoplasm of colon: Secondary | ICD-10-CM

## 2021-10-17 DIAGNOSIS — K5730 Diverticulosis of large intestine without perforation or abscess without bleeding: Secondary | ICD-10-CM

## 2021-10-17 DIAGNOSIS — K529 Noninfective gastroenteritis and colitis, unspecified: Secondary | ICD-10-CM

## 2021-10-17 MED ORDER — SODIUM CHLORIDE 0.9 % IV SOLN: 500.0000 mL | Freq: Once | INTRAVENOUS | Status: DC

## 2021-10-17 NOTE — Patient Instructions (Signed)
Discharge instructions given. Handout on Diverticulosis. Resume previous medications. YOU HAD AN ENDOSCOPIC PROCEDURE TODAY AT [facility]:   Refer to the procedure report that was given to you for any specific questions about what was found during the examination.  If the procedure report does not answer your questions, please call your gastroenterologist to clarify.  If you requested that your care partner not be given the details of your procedure findings, then the procedure report has been included in a sealed envelope for you to review at your convenience later.  YOU SHOULD EXPECT: Some feelings of bloating in the abdomen. Passage of more gas than usual.  Walking can help get rid of the air that was put into your GI tract during the procedure and reduce the bloating. If you had a lower endoscopy (such as a colonoscopy or flexible sigmoidoscopy) you may notice spotting of blood in your stool or on the toilet paper. If you underwent a bowel prep for your procedure, you may not have a normal bowel movement for a few days.  Please Note:  You might notice some irritation and congestion in your nose or some drainage.  This is from the oxygen used during your procedure.  There is no need for concern and it should clear up in a day or so.  SYMPTOMS TO REPORT IMMEDIATELY:  Following lower endoscopy (colonoscopy or flexible sigmoidoscopy):  Excessive amounts of blood in the stool  Significant tenderness or worsening of abdominal pains  Swelling of the abdomen that is new, acute  Fever of 100F or higher   For urgent or emergent issues, a gastroenterologist can be reached at any hour by calling [phone]. Do not use MyChart messaging for urgent concerns.    DIET:  We do recommend a small meal at first, but then you may proceed to your regular diet.  Drink plenty of fluids but you should avoid alcoholic beverages for 24 hours.  ACTIVITY:  You should plan to take it easy for  the rest of today and you should NOT DRIVE or use heavy machinery until tomorrow (because of the sedation medicines used during the test).    FOLLOW UP: Our staff will call the number listed on your records 48-72 hours following your procedure to check on you and address any questions or concerns that you may have regarding the information given to you following your procedure. If we do not reach you, we will leave a message.  We will attempt to reach you two times.  During this call, we will ask if you have developed any symptoms of COVID 19. If you develop any symptoms (ie: fever, flu-like symptoms, shortness of breath, cough etc.) before then, please call [phone].  If you test positive for Covid 19 in the 2 weeks post procedure, please call and report this information to [place].    If any biopsies were taken you will be contacted by phone or by letter within the next 1-3 weeks.  Please call us at [phone] if you have not heard about the biopsies in 3 weeks.    SIGNATURES/CONFIDENTIALITY: You and/or your care partner have signed paperwork which will be entered into your electronic medical record.  These signatures attest to the fact that that the information above on your After Visit Summary has been reviewed and is understood.  Full responsibility of the confidentiality of this discharge information lies with you and/or your care-partner.

## 2021-10-17 NOTE — Progress Notes (Signed)
History and Physical:  This patient presents for endoscopic testing for: Encounter Diagnoses  Name Primary?   Special screening for malignant neoplasms, colon Yes   Epigastric pain    Chronic diarrhea     Screening colon (incidental diarrhea) Was for EGD for epigastric pain, but insurance denied it (see other phone note about that)  ROS: Patient denies chest pain or cough   Past Medical History: Past Medical History:  Diagnosis Date   Amenorrhea    Anxiety    Back pain    Constipation    Dysmenorrhea    Joint pain    Palpitation    Shortness of breath    Urinary incontinence    Vitamin D deficiency      Past Surgical History: Past Surgical History:  Procedure Laterality Date   CESAREAN SECTION     CHOLECYSTECTOMY     COLONOSCOPY     TUBAL LIGATION      Allergies: Allergies  Allergen Reactions   Penicillins     All cilin drugs    Outpatient Meds: Current Outpatient Medications  Medication Sig Dispense Refill   ALPRAZolam (XANAX) 0.5 MG tablet TAKE 1 TABLET BY MOUTH AT BEDTIME AS NEEDED FOR ANXIETY 60 tablet 2   aspirin EC 81 MG tablet Take 1 tablet by mouth daily.     rosuvastatin (CRESTOR) 5 MG tablet One po at supper 3 times a week. 36 tablet 2   Vitamin D, Ergocalciferol, (DRISDOL) 1.25 MG (50000 UNIT) CAPS capsule TAKE 1 CAPSULE BY MOUTH ONE TIME PER WEEK 12 capsule 1   medroxyPROGESTERone (PROVERA) 5 MG tablet Take one tablet a day for 5 days every other month if no spontaneous menses. (Patient not taking: Reported on 10/17/2021) 15 tablet 1   Current Facility-Administered Medications  Medication Dose Route Frequency Provider Last Rate Last Admin   0.9 %  sodium chloride infusion  500 mL Intravenous Once Doran Stabler, MD          ___________________________________________________________________ Objective   Exam:  BP 114/74   Pulse 71   Temp 97.8 F (36.6 C) (Temporal)   Resp 14   Ht 5\' 7"  (1.702 m)   Wt 161 lb (73 kg)   SpO2 100%    BMI 25.22 kg/m   CV: RRR without murmur, S1/S2 Resp: clear to auscultation bilaterally, normal RR and effort noted GI: soft, no tenderness, with active bowel sounds.   Assessment: Encounter Diagnoses  Name Primary?   Special screening for malignant neoplasms, colon Yes   Epigastric pain    Chronic diarrhea      Plan: Colonoscopy   The patient is appropriate for an endoscopic procedure in the ambulatory setting.   - Wilfrid Lund, MD

## 2021-10-17 NOTE — Progress Notes (Signed)
Report given to PACU, vss 

## 2021-10-17 NOTE — Progress Notes (Signed)
Called to room to assist during endoscopic procedure.  Patient ID and intended procedure confirmed with present staff. Received instructions for my participation in the procedure from the performing physician.  

## 2021-10-17 NOTE — Telephone Encounter (Signed)
This pt's insurance denied her EGD, I spoke with the pt yesterday to inform the case was still pending and she wished to cancel EGD for now and just do the colonoscopy. I changed her procedure for only a colonoscopy procedure to be done.

## 2021-10-17 NOTE — Progress Notes (Signed)
VS-Allie

## 2021-10-17 NOTE — Op Note (Signed)
[place] [facility] Patient Name: Cheryl Watson Procedure Date: 10/17/2021 1:29 PM MRN: [id number] Endoscopist: Mallie Mussel L. Loletha Carrow , MD Age: 50 Referring MD:  Date of Birth: May 30, 1971 Gender: Female Account #: [id number] Procedure:                Colonoscopy Indications:              Screening for colorectal malignant neoplasm, This                            is the patient's first colonoscopy, Incidental                            diarrhea noted Medicines:                Monitored Anesthesia Care Procedure:                Pre-Anesthesia Assessment:                           - Prior to the procedure, a History and Physical                            was performed, and patient medications and                            allergies were reviewed. The patient's tolerance of                            previous anesthesia was also reviewed. The risks                            and benefits of the procedure and the sedation                            options and risks were discussed with the patient.                            All questions were answered, and informed consent                            was obtained. Prior Anticoagulants: The patient has                            taken no previous anticoagulant or antiplatelet                            agents. ASA Grade Assessment: II - A patient with                            mild systemic disease. After reviewing the risks                            and benefits, the patient was deemed in  satisfactory condition to undergo the procedure.                           After obtaining informed consent, the colonoscope                            was passed under direct vision. Throughout the                            procedure, the patient's blood pressure, pulse, and                            oxygen saturations were monitored continuously. The                            CF [id number] #[id number] was introduced through the  anus                            and advanced to the the terminal ileum, with                            identification of the appendiceal orifice and IC                            valve. The colonoscopy was performed without                            difficulty. The patient tolerated the procedure                            well. The quality of the bowel preparation was                            good. The terminal ileum, ileocecal valve,                            appendiceal orifice, and rectum were photographed. Scope In: 1:42:22 PM Scope Out: 1:59:54 PM Scope Withdrawal Time: 0 hours 13 minutes 23 seconds  Total Procedure Duration: 0 hours 17 minutes 32 seconds  Findings:                 The perianal and digital rectal examinations were                            normal.                           The terminal ileum appeared normal.                           Normal mucosa was found in the entire colon.                            Biopsies for histology were taken with a cold  forceps from the right colon and left colon for                            evaluation of microscopic colitis.                           Multiple diverticula were found in the left colon.                           The exam was otherwise without abnormality on                            direct and retroflexion views. Complications:            No immediate complications. Estimated Blood Loss:     Estimated blood loss was minimal. Estimated blood                            loss: none. Impression:               - The examined portion of the ileum was normal.                           - Normal mucosa in the entire examined colon.                            Biopsied.                           - Diverticulosis in the left colon.                           - The examination was otherwise normal on direct                            and retroflexion views. Recommendation:           - Patient has a  contact number available for                            emergencies. The signs and symptoms of potential                            delayed complications were discussed with the                            patient. Return to normal activities tomorrow.                            Written discharge instructions were provided to the                            patient.                           - Resume previous diet.                           -  Continue present medications.                           - Await pathology results.                           - Repeat colonoscopy in 10 years for screening                            purposes.                           - Reschedule planned EGD after insurance                            authorization obtained. Anajah Sterbenz L. Loletha Carrow, MD 10/17/2021 2:03:39 PM This report has been signed electronically.

## 2021-10-19 ENCOUNTER — Telehealth: Payer: Self-pay

## 2021-10-19 NOTE — Telephone Encounter (Signed)
  Follow up Call-  Call back number 10/17/2021  Post procedure Call Back phone  # [phone]  Permission to leave phone message Yes  Some recent data might be hidden     Patient questions:  Do you have a fever, pain , or abdominal swelling? No. Pain Score  0 *  Have you tolerated food without any problems? Yes.    Have you been able to return to your normal activities? Yes.    Do you have any questions about your discharge instructions: Diet   No. Medications  No. Follow up visit  No.  Do you have questions or concerns about your Care? No.  Actions: * If pain score is 4 or above: No action needed, pain <4.  Have you developed a fever since your procedure? no  2.   Have you had an respiratory symptoms (SOB or cough) since your procedure? no  3.   Have you tested positive for COVID 19 since your procedure no  4.   Have you had any family members/close contacts diagnosed with the COVID 19 since your procedure?  no   If yes to any of these questions please route to Joylene John, RN and Joella Prince, RN

## 2021-10-24 ENCOUNTER — Telehealth (INDEPENDENT_AMBULATORY_CARE_PROVIDER_SITE_OTHER): Payer: Managed Care, Other (non HMO) | Admitting: Internal Medicine

## 2021-10-24 ENCOUNTER — Telehealth: Payer: Self-pay | Admitting: Internal Medicine

## 2021-10-24 ENCOUNTER — Encounter: Payer: Self-pay | Admitting: Internal Medicine

## 2021-10-24 ENCOUNTER — Ambulatory Visit: Payer: Managed Care, Other (non HMO) | Admitting: Internal Medicine

## 2021-10-24 DIAGNOSIS — N390 Urinary tract infection, site not specified: Secondary | ICD-10-CM

## 2021-10-24 MED ORDER — FLUCONAZOLE 150 MG PO TABS: 150.0000 mg | ORAL_TABLET | Freq: Once | ORAL | 0 refills | Status: AC

## 2021-10-24 MED ORDER — CIPROFLOXACIN HCL 500 MG PO TABS: 500.0000 mg | ORAL_TABLET | Freq: Two times a day (BID) | ORAL | 0 refills | Status: AC

## 2021-10-24 NOTE — Telephone Encounter (Signed)
scheduled

## 2021-10-24 NOTE — Telephone Encounter (Addendum)
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  She now resides in [place].  She is unable to come to [facility] for an acute visit, so we connected by telephone only.  She is at her home in [place], and I am at my office here in [place].  She is identified using 2 identifiers as Cheryl Watson. Ring, a patient in this practice.  She is agreeable to visit in this format today.  She has a history of hyperlipidemia treated with Crestor and history vitamin D deficiency treated with weekly high-dose vitamin D.  History of impaired glucose tolerance that is diet controlled only and Hemoglobin A1c in April was normal at 5.1%.  She is allergic to Penicillin and Ampicillin.  History of cholecystectomy in 2001.  Dr. Talbert Nan is GYN physician.  She takes Provera for 5 days every other month if no spontaneous menses.  She has no fever or shaking chills.  No nausea or vomiting.  No severe back pain.  She saw a slight tinge of blood when she wiped  one time only.  Impression: Acute cystitis.  She seldom has had urinary tract infections.  Has taken Cipro in the past with success.  I cannot see in epic where we have treated her for a urinary tract infection previously.  I have called in Cipro 500 mg twice daily for 7 days.  Diflucan 150 mg tablet in case she develops Candida vaginitis while on antibiotics.  She will let me know if not better in 24 to 48 hours or sooner if worse.  These prescriptions were E scribed to pharmacy to [place] at her request.   Time spent with chart review, interviewing patient by phone, e-prescribing antibiotic to new pharmacy and medical decision making is 15 minutes.

## 2021-10-24 NOTE — Telephone Encounter (Signed)
Leeanne Rio [phone]  Otila Kluver called with urgency, burning, little blood, been going on for couple of days, really bad today.

## 2021-10-25 ENCOUNTER — Telehealth: Payer: Self-pay | Admitting: Gastroenterology

## 2021-10-25 DIAGNOSIS — K529 Noninfective gastroenteritis and colitis, unspecified: Secondary | ICD-10-CM

## 2021-10-25 DIAGNOSIS — R1013 Epigastric pain: Secondary | ICD-10-CM

## 2021-10-25 NOTE — Telephone Encounter (Signed)
Spoke with patient in regards to information below. Pt will come in for lab work at her convenience, she is aware that no appt is necessary. Pt will begin Prilosec 20 mg OTC daily until her office visit. Pt is scheduled for a follow up with Dr. Loletha Carrow on Monday, 11/26/21 at 10 am. Pt verbalized understanding and had no concerns at the end of the call.  Lab order in epic.

## 2021-10-25 NOTE — Telephone Encounter (Signed)
Thank you for sending me the following information from this patient's insurance company regarding denial of her EGD:  ""it must be needed for any of the following reasons: new onset of symptoms over 51 years old, failed treatments for h pylori or at least 4 weeks of ppi, first degree relative with a serious disease of the esophagus stomach or duodenum, unintentional weight loss of more than 5 percent during the past 12 months, suspected upper gi bleeding based on blood test, exam or history, anorexia, trouble swallowing, chest pain when swallowing, throwing up for at least 7 days, abnormal results of imaging of esophagus stomach or duodenum, suspected cancer, physical exams finding a probable mass"   ______________________________   Therefore, please inform the patient of this, and ask her to come to our lab 1 day soon for an H. pylori IgG antibody, and to buy some over-the-counter Prilosec 20 mg and take 1 tablet daily.  She needs clinic follow-up with me, and if her H. pylori is negative and her abdominal pain has not improved after the 4-week trial of PPI, then perhaps an upper endoscopy will be approved at that point  - H. Danis

## 2021-10-26 NOTE — Addendum Note (Signed)
Addended by: Elby Showers on: 10/26/2021 09:43 AM   Modules accepted: Level of Service

## 2021-11-01 ENCOUNTER — Other Ambulatory Visit: Payer: Managed Care, Other (non HMO) | Admitting: Internal Medicine

## 2021-11-02 ENCOUNTER — Ambulatory Visit: Payer: Managed Care, Other (non HMO) | Admitting: Internal Medicine

## 2021-11-13 ENCOUNTER — Other Ambulatory Visit: Payer: Self-pay | Admitting: Internal Medicine

## 2021-11-26 ENCOUNTER — Ambulatory Visit: Payer: Managed Care, Other (non HMO) | Admitting: Gastroenterology

## 2021-11-26 NOTE — Progress Notes (Deleted)
     [facility] Progress Note  Chief Complaint: Abdominal pain and diarrhea  Subjective  History: Otila Kluver follows up for her chronic digestive symptoms.  She was seen in early September with chronic diarrhea and primarily upper abdominal pain, clinical details in that note.  Plan was for EGD and colonoscopy, though her insurance would not approve the EGD (separate phone and result notes about this).  Colonoscopy to terminal ileum normal, biopsies negative for microscopic colitis.  Recommendation was H. pylori IgG antibody and trial of omeprazole 20 mg once daily. ***  ROS: Cardiovascular:  no chest pain Respiratory: no dyspnea  The patient's Past Medical, Family and Social History were reviewed and are on file in the EMR.  Objective:  Med list reviewed  Current Outpatient Medications:    ALPRAZolam (XANAX) 0.5 MG tablet, TAKE 1 TABLET BY MOUTH AT BEDTIME AS NEEDED FOR ANXIETY, Disp: 60 tablet, Rfl: 5   aspirin EC 81 MG tablet, Take 1 tablet by mouth daily., Disp: , Rfl:    medroxyPROGESTERone (PROVERA) 5 MG tablet, Take one tablet a day for 5 days every other month if no spontaneous menses. (Patient not taking: Reported on 10/17/2021), Disp: 15 tablet, Rfl: 1   rosuvastatin (CRESTOR) 5 MG tablet, One po at supper 3 times a week., Disp: 36 tablet, Rfl: 2   Vitamin D, Ergocalciferol, (DRISDOL) 1.25 MG (50000 UNIT) CAPS capsule, TAKE 1 CAPSULE BY MOUTH ONE TIME PER WEEK, Disp: 12 capsule, Rfl: 1   Vital signs in last 24 hrs: There were no vitals filed for this visit. Wt Readings from Last 3 Encounters:  10/17/21 161 lb (73 kg)  09/05/21 161 lb (73 kg)  04/27/21 162 lb (73.5 kg)    Physical Exam  *** HEENT: sclera anicteric, oral mucosa moist without lesions Neck: supple, no thyromegaly, JVD or lymphadenopathy Cardiac: RRR without murmurs, S1S2 heard, no peripheral edema Pulm: clear to auscultation bilaterally, normal RR and effort noted Abdomen: soft, *** tenderness, with  active bowel sounds. No guarding or palpable hepatosplenomegaly. Skin; warm and dry, no jaundice or rash  Labs:  No H. pylori results. ___________________________________________ Radiologic studies:   ____________________________________________ Other:   _____________________________________________ Assessment & Plan  Assessment: No diagnosis found.    Plan:   *** minutes were spent on this encounter (including chart review, history/exam, counseling/coordination of care, and documentation) > 50% of that time was spent on counseling and coordination of care.   Nelida Meuse III

## 2021-11-30 ENCOUNTER — Other Ambulatory Visit: Payer: Managed Care, Other (non HMO) | Admitting: Internal Medicine

## 2021-11-30 ENCOUNTER — Other Ambulatory Visit: Payer: Self-pay

## 2021-11-30 DIAGNOSIS — R7989 Other specified abnormal findings of blood chemistry: Secondary | ICD-10-CM

## 2021-11-30 DIAGNOSIS — E1169 Type 2 diabetes mellitus with other specified complication: Secondary | ICD-10-CM

## 2021-11-30 DIAGNOSIS — E7800 Pure hypercholesterolemia, unspecified: Secondary | ICD-10-CM

## 2021-11-30 NOTE — Progress Notes (Signed)
Lab only 

## 2021-11-30 NOTE — Addendum Note (Signed)
Addended by: Randolm Idol A on: 11/30/2021 11:03 AM   Modules accepted: Orders

## 2021-12-01 LAB — LIPID PANEL
Cholesterol: 223 mg/dL — ABNORMAL HIGH (ref <200)
HDL: 74 mg/dL (ref >=50)
LDL Cholesterol (Calc): 120 mg/dL (calc) — ABNORMAL HIGH
Non-HDL Cholesterol (Calc): 149 mg/dL (calc) — ABNORMAL HIGH (ref <130)
Total CHOL/HDL Ratio: 3 (calc) (ref <5.0)
Triglycerides: 170 mg/dL — ABNORMAL HIGH (ref <150)

## 2021-12-01 LAB — HEPATIC FUNCTION PANEL
AG Ratio: 2.1 (calc) (ref 1.0–2.5)
ALT: 12 U/L (ref 6–29)
AST: 15 U/L (ref 10–35)
Albumin: 4.4 g/dL (ref 3.6–5.1)
Alkaline phosphatase (APISO): 71 U/L (ref 37–153)
Bilirubin, Direct: 0.1 mg/dL (ref 0.0–0.2)
Globulin: 2.1 g/dL (calc) (ref 1.9–3.7)
Indirect Bilirubin: 0.5 mg/dL (calc) (ref 0.2–1.2)
Total Bilirubin: 0.6 mg/dL (ref 0.2–1.2)
Total Protein: 6.5 g/dL (ref 6.1–8.1)

## 2021-12-04 ENCOUNTER — Ambulatory Visit: Payer: Managed Care, Other (non HMO) | Admitting: Internal Medicine

## 2021-12-08 ENCOUNTER — Encounter: Payer: Self-pay | Admitting: Internal Medicine

## 2022-01-15 ENCOUNTER — Other Ambulatory Visit: Payer: Self-pay

## 2022-01-15 MED ORDER — VITAMIN D (ERGOCALCIFEROL) 1.25 MG (50000 UNIT) PO CAPS: ORAL_CAPSULE | ORAL | 1 refills | Status: DC

## 2022-05-16 ENCOUNTER — Other Ambulatory Visit: Payer: Self-pay | Admitting: Internal Medicine

## 2022-06-06 ENCOUNTER — Other Ambulatory Visit: Payer: Federal, State, Local not specified - PPO

## 2022-06-06 DIAGNOSIS — E559 Vitamin D deficiency, unspecified: Secondary | ICD-10-CM | POA: Diagnosis not present

## 2022-06-06 DIAGNOSIS — Z8639 Personal history of other endocrine, nutritional and metabolic disease: Secondary | ICD-10-CM

## 2022-06-06 DIAGNOSIS — E7800 Pure hypercholesterolemia, unspecified: Secondary | ICD-10-CM

## 2022-06-06 LAB — HEPATIC FUNCTION PANEL
AG Ratio: 1.8 (calc) (ref 1.0–2.5)
ALT: 19 U/L (ref 6–29)
AST: 19 U/L (ref 10–35)
Albumin: 4.4 g/dL (ref 3.6–5.1)
Alkaline phosphatase (APISO): 72 U/L (ref 37–153)
Bilirubin, Direct: 0.1 mg/dL (ref 0.0–0.2)
Globulin: 2.4 g/dL (calc) (ref 1.9–3.7)
Indirect Bilirubin: 0.4 mg/dL (calc) (ref 0.2–1.2)
Total Bilirubin: 0.5 mg/dL (ref 0.2–1.2)
Total Protein: 6.8 g/dL (ref 6.1–8.1)

## 2022-06-06 LAB — LIPID PANEL
Cholesterol: 266 mg/dL — ABNORMAL HIGH (ref <200)
HDL: 77 mg/dL (ref >=50)
LDL Cholesterol (Calc): 160 mg/dL (calc) — ABNORMAL HIGH
Non-HDL Cholesterol (Calc): 189 mg/dL (calc) — ABNORMAL HIGH (ref <130)
Total CHOL/HDL Ratio: 3.5 (calc) (ref <5.0)
Triglycerides: 157 mg/dL — ABNORMAL HIGH (ref <150)

## 2022-06-06 LAB — VITAMIN D 25 HYDROXY (VIT D DEFICIENCY, FRACTURES): Vit D, 25-Hydroxy: 73 ng/mL (ref 30–100)

## 2022-06-07 ENCOUNTER — Encounter: Payer: Self-pay | Admitting: Internal Medicine

## 2022-06-07 ENCOUNTER — Ambulatory Visit: Payer: Federal, State, Local not specified - PPO | Admitting: Internal Medicine

## 2022-06-07 VITALS — BP 122/80 | HR 85 | Temp 98.2°F | Ht 67.0 in | Wt 178.0 lb

## 2022-06-07 DIAGNOSIS — E119 Type 2 diabetes mellitus without complications: Secondary | ICD-10-CM

## 2022-06-07 DIAGNOSIS — Z8639 Personal history of other endocrine, nutritional and metabolic disease: Secondary | ICD-10-CM

## 2022-06-07 DIAGNOSIS — Z6827 Body mass index (BMI) 27.0-27.9, adult: Secondary | ICD-10-CM

## 2022-06-07 DIAGNOSIS — F419 Anxiety disorder, unspecified: Secondary | ICD-10-CM

## 2022-06-07 DIAGNOSIS — I2510 Atherosclerotic heart disease of native coronary artery without angina pectoris: Secondary | ICD-10-CM

## 2022-06-07 MED ORDER — ROSUVASTATIN CALCIUM 10 MG PO TABS: 10.0000 mg | ORAL_TABLET | Freq: Every day | ORAL | 3 refills | Status: DC

## 2022-06-07 NOTE — Progress Notes (Unsigned)
   Subjective:    Patient ID: Cheryl Watson, female    DOB: 1971-01-30, 51 y.o.   MRN: [id number]  HPI 51 year old  Female seen for follow-up.    Review of Systems     Objective:   Physical Exam        Assessment & Plan:

## 2022-06-07 NOTE — Patient Instructions (Signed)
Increase Crestor to 10 mg daily and follow up in September.

## 2022-08-08 ENCOUNTER — Ambulatory Visit (HOSPITAL_COMMUNITY)
Admission: RE | Admit: 2022-08-08 | Discharge: 2022-08-08 | Disposition: A | Discharge status: Home / Self Care | Payer: Self-pay | Source: Ambulatory Visit | Attending: Internal Medicine | Admitting: Internal Medicine

## 2022-08-08 DIAGNOSIS — I2510 Atherosclerotic heart disease of native coronary artery without angina pectoris: Secondary | ICD-10-CM | POA: Insufficient documentation

## 2022-08-12 NOTE — Progress Notes (Signed)
Called Cheryl Watson and the report made since to her and she said she did not need and appointment at this time. She will call back if she changes her mind.

## 2022-09-05 ENCOUNTER — Other Ambulatory Visit: Payer: Federal, State, Local not specified - PPO

## 2022-09-05 DIAGNOSIS — E7800 Pure hypercholesterolemia, unspecified: Secondary | ICD-10-CM

## 2022-09-06 ENCOUNTER — Ambulatory Visit: Payer: Federal, State, Local not specified - PPO | Admitting: Internal Medicine

## 2022-09-10 ENCOUNTER — Other Ambulatory Visit: Payer: Federal, State, Local not specified - PPO

## 2022-09-10 DIAGNOSIS — R7989 Other specified abnormal findings of blood chemistry: Secondary | ICD-10-CM | POA: Diagnosis not present

## 2022-09-10 DIAGNOSIS — E7800 Pure hypercholesterolemia, unspecified: Secondary | ICD-10-CM

## 2022-09-10 DIAGNOSIS — E119 Type 2 diabetes mellitus without complications: Secondary | ICD-10-CM | POA: Diagnosis not present

## 2022-09-10 DIAGNOSIS — I2510 Atherosclerotic heart disease of native coronary artery without angina pectoris: Secondary | ICD-10-CM | POA: Diagnosis not present

## 2022-09-11 ENCOUNTER — Ambulatory Visit (INDEPENDENT_AMBULATORY_CARE_PROVIDER_SITE_OTHER): Payer: Federal, State, Local not specified - PPO | Admitting: Internal Medicine

## 2022-09-11 ENCOUNTER — Encounter: Payer: Self-pay | Admitting: Internal Medicine

## 2022-09-11 VITALS — BP 122/76 | HR 75 | Temp 98.3°F | Ht 67.0 in | Wt 181.4 lb

## 2022-09-11 DIAGNOSIS — Z8639 Personal history of other endocrine, nutritional and metabolic disease: Secondary | ICD-10-CM

## 2022-09-11 DIAGNOSIS — F419 Anxiety disorder, unspecified: Secondary | ICD-10-CM

## 2022-09-11 DIAGNOSIS — I2510 Atherosclerotic heart disease of native coronary artery without angina pectoris: Secondary | ICD-10-CM | POA: Diagnosis not present

## 2022-09-11 LAB — HEPATIC FUNCTION PANEL
AG Ratio: 1.9 (calc) (ref 1.0–2.5)
ALT: 17 U/L (ref 6–29)
AST: 17 U/L (ref 10–35)
Albumin: 4.6 g/dL (ref 3.6–5.1)
Alkaline phosphatase (APISO): 89 U/L (ref 37–153)
Bilirubin, Direct: 0.1 mg/dL (ref 0.0–0.2)
Globulin: 2.4 g/dL (calc) (ref 1.9–3.7)
Indirect Bilirubin: 0.4 mg/dL (calc) (ref 0.2–1.2)
Total Bilirubin: 0.5 mg/dL (ref 0.2–1.2)
Total Protein: 7 g/dL (ref 6.1–8.1)

## 2022-09-11 LAB — LIPID PANEL
Cholesterol: 201 mg/dL — ABNORMAL HIGH (ref <200)
HDL: 83 mg/dL (ref >=50)
LDL Cholesterol (Calc): 100 mg/dL (calc) — ABNORMAL HIGH
Non-HDL Cholesterol (Calc): 118 mg/dL (calc) (ref <130)
Total CHOL/HDL Ratio: 2.4 (calc) (ref <5.0)
Triglycerides: 85 mg/dL (ref <150)

## 2022-09-11 NOTE — Patient Instructions (Addendum)
I am pleased with your lipid control on increased dose of Crestor 10 mg daily.  Please return in April 2024 for health maintenance exam and fasting labs.  Continue high-dose vitamin D weekly.

## 2022-09-11 NOTE — Progress Notes (Signed)
   Subjective:    Patient ID: Cheryl Watson, female    DOB: [birth year], 51 y.o.   MRN: [id number]  HPI 51 year old Female seen for follow-up on pure hypercholesterolemia.  She was seen in June and at that time her cholesterol had increased from 223 in December  2022 to 266.However her triglycerides had improved from 170 to 157. LDL had increased from 120 to 160.  We decided at that time she would be better off taking Crestor 10 mg daily and she was started on that immediately.  She did have CT coronary scoring evaluation.  Her chest CT was normal and her calcium score was 95.  This score would suggest it would be reasonable to be on statin therapy should she is in her 18s.  She is agreeable to staying on Crestor 10 mg daily.  Her total cholesterol has decreased from 266 in June to 201.  Her LDL is now 100.  HDL has gone up from 77 to 83..  Overall I am quite pleased with her progress.  She feels well on this increased dose of statin.  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  We spoke briefly about various options for weight control.  Vaccines discussed briefly.  History of anxiety and insomnia treated with Xanax as needed at bedtime.  She is on  vitamin D supplement 50,000 units weekly.  She will continue with this.  Vitamin D level in April 2022 was 67.  Review of Systems see above     Objective:   Physical Exam  Blood pressure 122/76, pulse 75, temperature 98.3 degrees, pulse oximetry 99% ,weight 181 pounds 6.4 ounces, BMI 28.41  In April 2022, her weight was 162 pounds and BMI was 26.15    Assessment & Plan:  History of vitamin D deficiency-continue high-dose vitamin D weekly  Hyperlipidemia-markedly improved on Crestor 10 mg daily.  Continue this dose and follow-up with health maintenance exam and fasting labs in April 2024  History of vitamin D deficiency-continue high-dose vitamin D weekly  History of  anxiety continue Xanax at bedtime as needed

## 2022-09-17 ENCOUNTER — Other Ambulatory Visit: Payer: Self-pay | Admitting: Internal Medicine

## 2022-10-05 DIAGNOSIS — R1031 Right lower quadrant pain: Secondary | ICD-10-CM | POA: Diagnosis not present

## 2022-10-05 DIAGNOSIS — N200 Calculus of kidney: Secondary | ICD-10-CM | POA: Diagnosis not present

## 2022-10-08 ENCOUNTER — Encounter: Payer: Self-pay | Admitting: Internal Medicine

## 2022-11-08 DIAGNOSIS — H16002 Unspecified corneal ulcer, left eye: Secondary | ICD-10-CM | POA: Diagnosis not present

## 2022-11-09 DIAGNOSIS — H16002 Unspecified corneal ulcer, left eye: Secondary | ICD-10-CM | POA: Diagnosis not present

## 2022-11-11 DIAGNOSIS — H16022 Ring corneal ulcer, left eye: Secondary | ICD-10-CM | POA: Diagnosis not present

## 2022-11-13 DIAGNOSIS — H16022 Ring corneal ulcer, left eye: Secondary | ICD-10-CM | POA: Diagnosis not present

## 2022-11-15 DIAGNOSIS — H16022 Ring corneal ulcer, left eye: Secondary | ICD-10-CM | POA: Diagnosis not present

## 2022-11-25 DIAGNOSIS — H16022 Ring corneal ulcer, left eye: Secondary | ICD-10-CM | POA: Diagnosis not present

## 2023-01-09 ENCOUNTER — Other Ambulatory Visit: Payer: Self-pay | Admitting: Internal Medicine

## 2023-02-18 IMAGING — MG MM DIGITAL SCREENING BILAT W/ TOMO AND CAD
8 series · 8 of 24 positions shown · non-contrast
Comparison: Previous exam(s).

CLINICAL DATA: Screening.

EXAM:
DIGITAL SCREENING BILATERAL MAMMOGRAM WITH TOMOSYNTHESIS AND CAD
TECHNIQUE: Bilateral screening digital craniocaudal and mediolateral oblique
mammograms were obtained. Bilateral screening digital breast
tomosynthesis was performed. The images were evaluated with
computer-aided detection.

[L CC synth-2D]
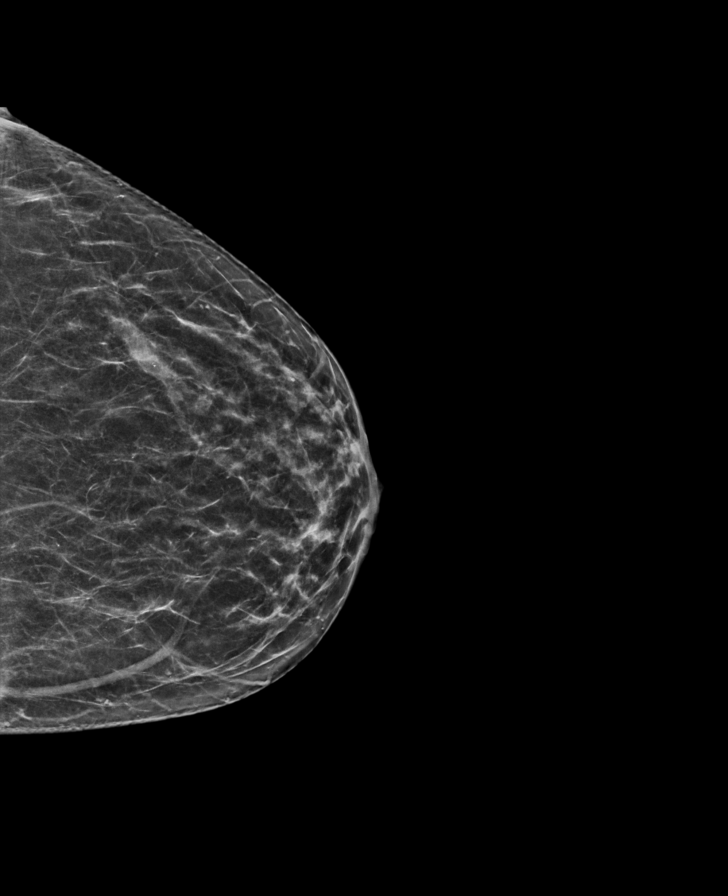

[L MLO synth-2D]
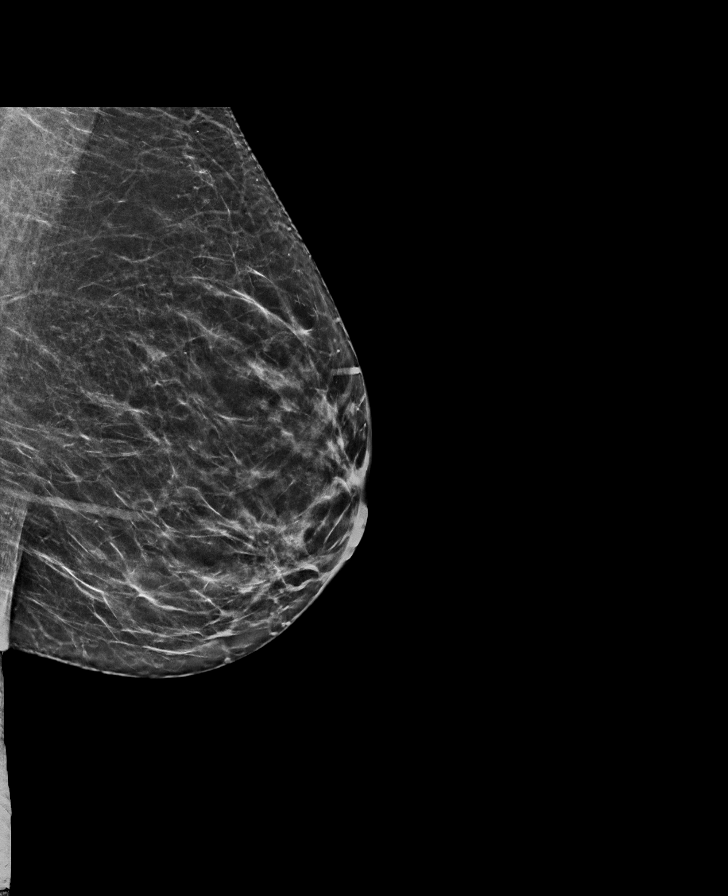

[R CC synth-2D]
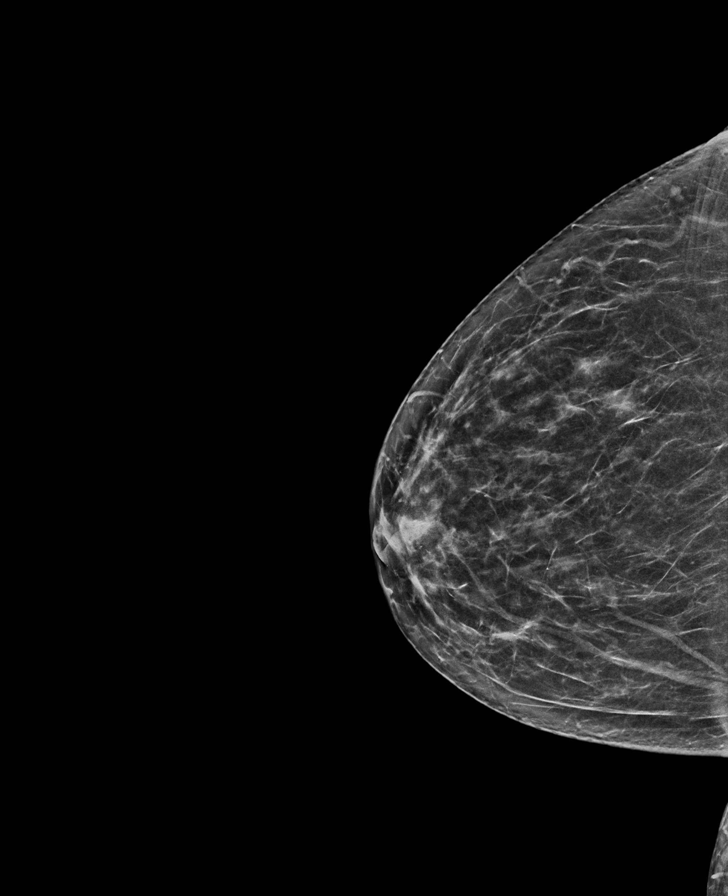

[R MLO synth-2D]
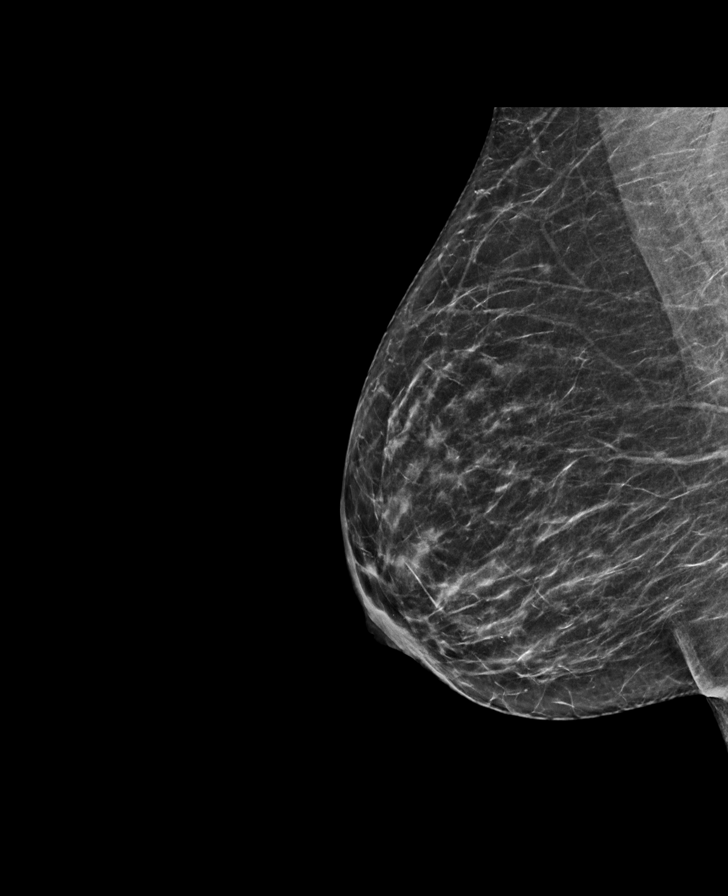

[L CC tomo · tomo slice 30/59.0]
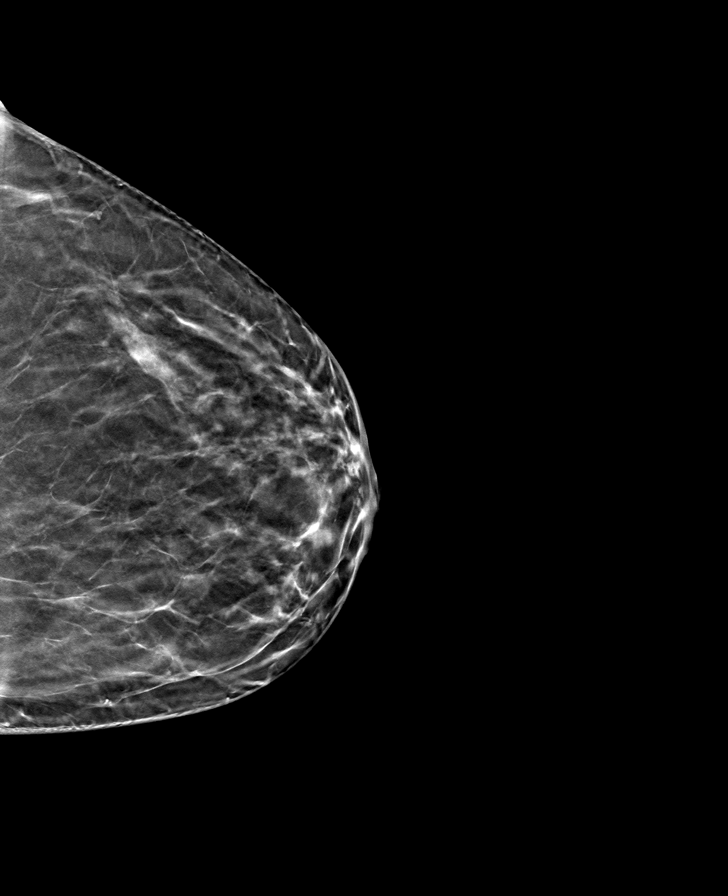

[R MLO tomo · tomo slice 32/63.0]
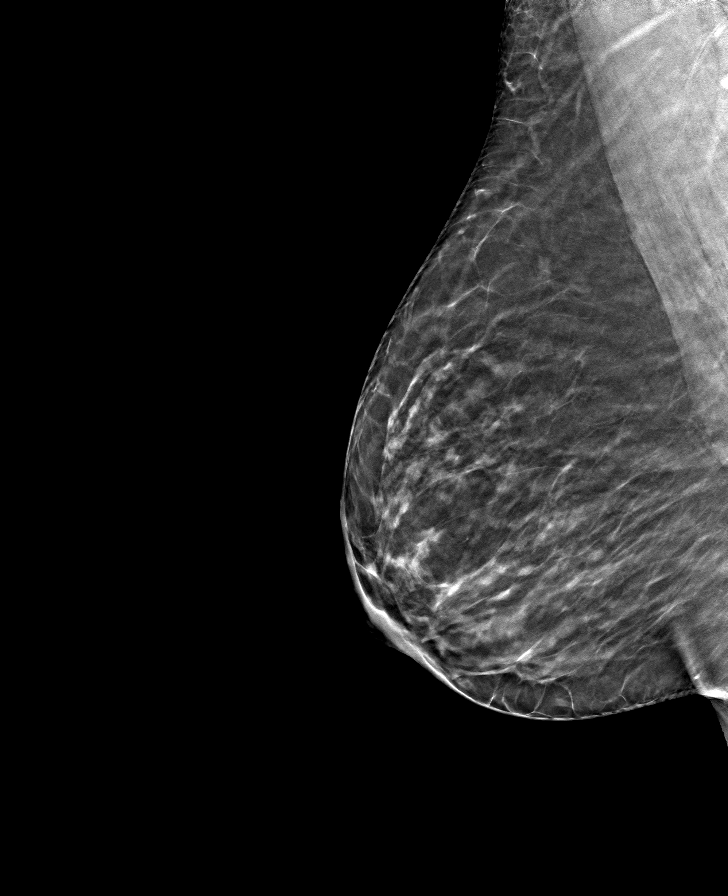

[R CC tomo · tomo slice 31/60.0]
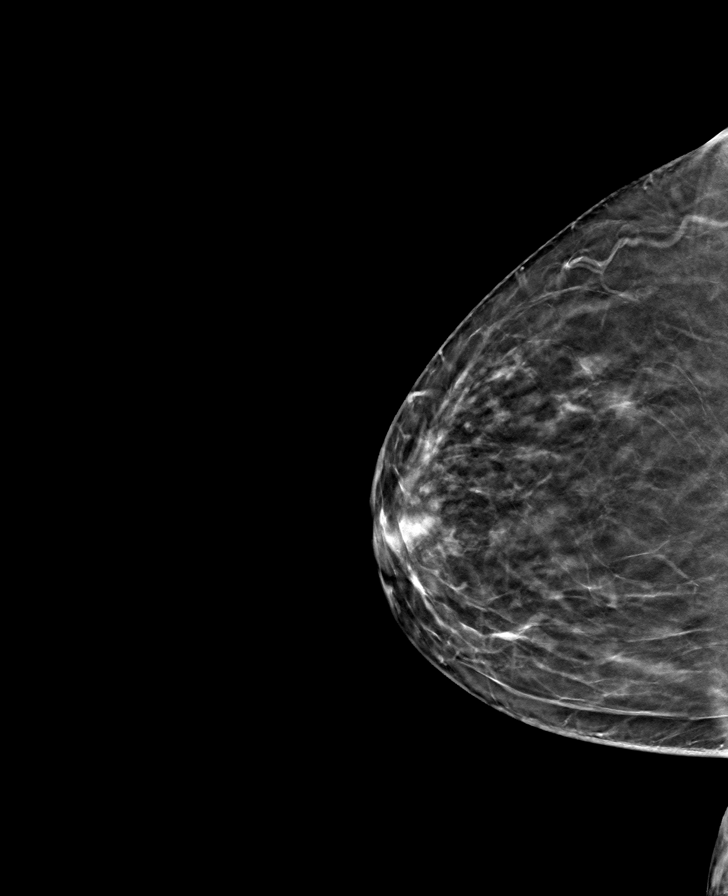

[L MLO tomo · tomo slice 32/63.0]
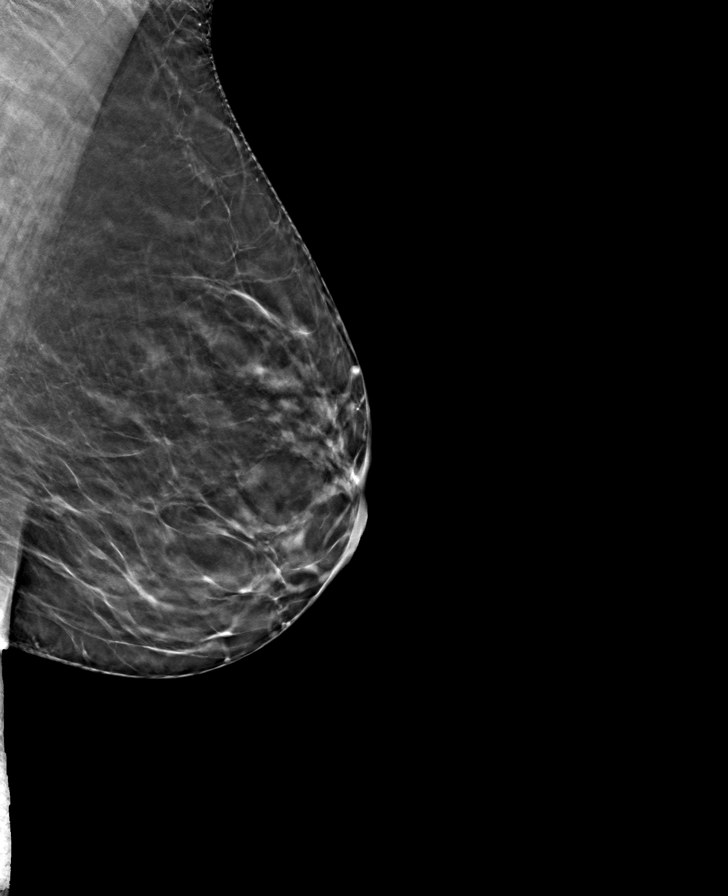

[8 of 24 positions shown; findings below may reference images not displayed]

ACR Breast Density Category b: There are scattered areas of
fibroglandular density.
FINDINGS: There are no findings suspicious for malignancy.
IMPRESSION: No mammographic evidence of malignancy. A result letter of this
screening mammogram will be mailed directly to the patient.

RECOMMENDATION:
Screening mammogram in one year. (Code:51-O-LD2)

BI-RADS CATEGORY  1: Negative.

## 2023-04-22 NOTE — Progress Notes (Signed)
Patient Care Team: Margaree Mackintosh, MD as PCP - General (Internal Medicine)  Visit Date: 04/29/23  Subjective:    Patient ID: Cheryl Watson , Female   DOB: [birth year], 52 y.o.    MRN: [id number]   52 y.o. Female presents today for annual comprehensive physical exam. Patient has a past medical history of amenorrhea, anxiety, back pain, constipation, dysmenorrhea, joint pain, palpitation, shortness of breath, Vitamin D deficiency.  Discussed ED visit and related concerns for atherosclerosis. Seen in ED 10/05/22 for right flank pain. CT showed punctate bilateral kidney stones without obstructing stone. Believed to have passed a small stone or have a small stone at the time. Treated conservatively with IV fluids, pain meds in ED.  History of anxiety treated with alprazolam 0.5 mg at bedtime as needed.  History of hyperlipidemia treated with rosuvastatin 10 mg daily. Lipid panel normal on 04/28/23.  History of Vitamin D deficiency treated with Drisdol 50,000 units weekly.  TSH normal at 1.23. Glucose normal. Kidney, liver function normal. Electrolytes normal. Blood proteins normal. CBC normal.  Pap smear last completed 04/17/20. Negative for intraepithelial lesion or malignancy. Recommended repeat in 2024.  Mammogram last completed 07/19/21. No mammographic evidence of malignancy. Recommended repeat in 2023.  Colonoscopy last completed 10/17/21. Results showed diverticulosis in left colon. Examination otherwise normal. Recommended repeat in 2032.  Past Medical History:  Diagnosis Date   Amenorrhea    Anxiety    Back pain    Constipation    Dysmenorrhea    Joint pain    Palpitation    Shortness of breath    Urinary incontinence    Vitamin D deficiency      Family History  Problem Relation Age of Onset   Depression Mother    Obesity Mother    Neuropathy Father    Alcohol abuse Father    High blood pressure Father    High Cholesterol Father    Depression Father     Sleep apnea Father    Drug abuse Father    Breast cancer Paternal Aunt 74   Colon cancer Paternal Uncle    Esophageal cancer Other    Rectal cancer Neg Hx    Stomach cancer Neg Hx     Social History   Social History Narrative   Not on file      Review of Systems  Constitutional:  Negative for chills, fever, malaise/fatigue and weight loss.  HENT:  Negative for hearing loss, sinus pain and sore throat.   Respiratory:  Negative for cough, hemoptysis and shortness of breath.   Cardiovascular:  Negative for chest pain, palpitations, leg swelling and PND.  Gastrointestinal:  Negative for abdominal pain, constipation, diarrhea, heartburn, nausea and vomiting.  Genitourinary:  Negative for dysuria, frequency and urgency.  Musculoskeletal:  Negative for back pain, myalgias and neck pain.  Skin:  Negative for itching and rash.  Neurological:  Negative for dizziness, tingling, seizures and headaches.  Endo/Heme/Allergies:  Negative for polydipsia.  Psychiatric/Behavioral:  Negative for depression. The patient is not nervous/anxious.         Objective:   Vitals: BP 124/80   Pulse 82   Temp 98.1 F (36.7 C) (Tympanic)   Ht 5' 6.5" (1.689 m)   Wt 176 lb (79.8 kg)   LMP 01/28/2022   BMI 27.98 kg/m    Physical Exam Vitals and nursing note reviewed.  Constitutional:      General: She is not in acute distress.    Appearance:  Normal appearance. She is not ill-appearing or toxic-appearing.  HENT:     Head: Normocephalic and atraumatic.     Right Ear: Hearing, tympanic membrane, ear canal and external ear normal.     Left Ear: Hearing, tympanic membrane, ear canal and external ear normal.     Mouth/Throat:     Pharynx: Oropharynx is clear.  Eyes:     Extraocular Movements: Extraocular movements intact.     Pupils: Pupils are equal, round, and reactive to light.  Neck:     Thyroid: No thyroid mass, thyromegaly or thyroid tenderness.     Vascular: No carotid bruit.   Cardiovascular:     Rate and Rhythm: Normal rate and regular rhythm. No extrasystoles are present.    Pulses:          Dorsalis pedis pulses are 1+ on the right side and 1+ on the left side.     Heart sounds: Normal heart sounds. No murmur heard.    No friction rub. No gallop.  Pulmonary:     Effort: Pulmonary effort is normal.     Breath sounds: Normal breath sounds. No decreased breath sounds, wheezing, rhonchi or rales.  Chest:     Chest wall: No mass.  Abdominal:     Palpations: Abdomen is soft. There is no hepatomegaly, splenomegaly or mass.     Tenderness: There is no abdominal tenderness.     Hernia: No hernia is present.  Musculoskeletal:     Cervical back: Normal range of motion.     Right lower leg: No edema.     Left lower leg: No edema.  Lymphadenopathy:     Cervical: No cervical adenopathy.     Upper Body:     Right upper body: No supraclavicular adenopathy.     Left upper body: No supraclavicular adenopathy.  Skin:    General: Skin is warm and dry.  Neurological:     General: No focal deficit present.     Mental Status: She is alert and oriented to person, place, and time. Mental status is at baseline.     Sensory: Sensation is intact.     Motor: Motor function is intact. No weakness.     Deep Tendon Reflexes: Reflexes are normal and symmetric.  Psychiatric:        Attention and Perception: Attention normal.        Mood and Affect: Mood normal.        Speech: Speech normal.        Behavior: Behavior normal.        Thought Content: Thought content normal.        Cognition and Memory: Cognition normal.        Judgment: Judgment normal.       Results:   Studies obtained and personally reviewed by me:  Pap smear last completed 04/17/20. Negative for intraepithelial lesion or malignancy. Recommended repeat in 2024.  Mammogram last completed 07/19/21. No mammographic evidence of malignancy. Recommended repeat in 2023.  Colonoscopy last completed 10/17/21.  Results show diverticulosis in left colon. Examination otherwise normal. Recommended repeat in 2032.   Labs:       Component Value Date/Time   NA 142 04/28/2023 0917   K 4.7 04/28/2023 0917   CL 108 04/28/2023 0917   CO2 27 04/28/2023 0917   GLUCOSE 77 04/28/2023 0917   BUN 16 04/28/2023 0917   CREATININE 0.58 04/28/2023 0917   CALCIUM 9.8 04/28/2023 0917   PROT 6.8 04/28/2023 0917  ALBUMIN 3.8 05/27/2017 1035   AST 16 04/28/2023 0917   ALT 14 04/28/2023 0917   ALKPHOS 52 05/27/2017 1035   BILITOT 0.4 04/28/2023 0917   GFRNONAA 105 04/19/2021 0937   GFRAA 122 04/19/2021 0937     Lab Results  Component Value Date   WBC 7.4 04/28/2023   HGB 12.8 04/28/2023   HCT 38.3 04/28/2023   MCV 92.7 04/28/2023   PLT 236 04/28/2023    Lab Results  Component Value Date   CHOL 168 04/28/2023   HDL 57 04/28/2023   LDLCALC 94 04/28/2023   TRIG 79 04/28/2023   CHOLHDL 2.9 04/28/2023    Lab Results  Component Value Date   HGBA1C 5.1 04/19/2021     Lab Results  Component Value Date   TSH 1.23 04/28/2023      Assessment & Plan:   Anxiety: treated with alprazolam 0.5 mg at bedtime as needed. Refilled.  Hyperlipidemia: treated with rosuvastatin 10 mg daily. Lipid panel normal on 04/28/23.  Vitamin D deficiency: treated with Drisdol 50,000 units weekly. Refilled.  TSH normal at 1.23. Glucose normal. Kidney, liver function normal. Electrolytes normal. Blood proteins normal. CBC normal.  Pap smear last completed 04/17/20. Negative for intraepithelial lesion or malignancy. Recommended repeat in 2024. Referral for new gynecologist as hers is retiring.  Mammogram last completed 07/19/21. No mammographic evidence of malignancy. Recommended repeat in 2024.  Colonoscopy last completed 10/17/21. Results showed diverticulosis in left colon. Examination otherwise normal. Recommended repeat in 2032.  Vaccine counseling: UTD on flu vaccine. Will go to pharmacy for tetanus  booster.    I,Alexander Ruley,acting as a Neurosurgeon for Margaree Mackintosh, MD.,have documented all relevant documentation on the behalf of Margaree Mackintosh, MD,as directed by  Margaree Mackintosh, MD while in the presence of Margaree Mackintosh, MD.   I, Margaree Mackintosh, MD, have reviewed all documentation for this visit. The documentation on 04/29/23 for the exam, diagnosis, procedures, and orders are all accurate and complete.

## 2023-04-28 ENCOUNTER — Other Ambulatory Visit: Payer: Federal, State, Local not specified - PPO

## 2023-04-28 DIAGNOSIS — E785 Hyperlipidemia, unspecified: Secondary | ICD-10-CM

## 2023-04-28 DIAGNOSIS — Z136 Encounter for screening for cardiovascular disorders: Secondary | ICD-10-CM

## 2023-04-28 DIAGNOSIS — E119 Type 2 diabetes mellitus without complications: Secondary | ICD-10-CM

## 2023-04-28 DIAGNOSIS — R5383 Other fatigue: Secondary | ICD-10-CM | POA: Diagnosis not present

## 2023-04-28 LAB — CBC WITH DIFFERENTIAL/PLATELET
Basophils Absolute: 37 cells/uL (ref 0–200)
Basophils Relative: 0.5 %
HCT: 38.3 % (ref 35.0–45.0)
Hemoglobin: 12.8 g/dL (ref 11.7–15.5)
Lymphs Abs: 2479 cells/uL (ref 850–3900)
Total Lymphocyte: 33.5 %
WBC: 7.4 10*3/uL (ref 3.8–10.8)

## 2023-04-29 ENCOUNTER — Ambulatory Visit (INDEPENDENT_AMBULATORY_CARE_PROVIDER_SITE_OTHER): Payer: Federal, State, Local not specified - PPO | Admitting: Internal Medicine

## 2023-04-29 ENCOUNTER — Encounter: Payer: Self-pay | Admitting: Internal Medicine

## 2023-04-29 VITALS — BP 124/80 | HR 82 | Temp 98.1°F | Ht 66.5 in | Wt 176.0 lb

## 2023-04-29 DIAGNOSIS — F419 Anxiety disorder, unspecified: Secondary | ICD-10-CM

## 2023-04-29 DIAGNOSIS — E119 Type 2 diabetes mellitus without complications: Secondary | ICD-10-CM | POA: Diagnosis not present

## 2023-04-29 DIAGNOSIS — I2510 Atherosclerotic heart disease of native coronary artery without angina pectoris: Secondary | ICD-10-CM

## 2023-04-29 DIAGNOSIS — Z23 Encounter for immunization: Secondary | ICD-10-CM

## 2023-04-29 DIAGNOSIS — Z6827 Body mass index (BMI) 27.0-27.9, adult: Secondary | ICD-10-CM

## 2023-04-29 DIAGNOSIS — Z0000 Encounter for general adult medical examination without abnormal findings: Secondary | ICD-10-CM

## 2023-04-29 DIAGNOSIS — Z8639 Personal history of other endocrine, nutritional and metabolic disease: Secondary | ICD-10-CM | POA: Diagnosis not present

## 2023-04-29 LAB — POCT URINALYSIS DIPSTICK
Bilirubin, UA: NEGATIVE
Blood, UA: NEGATIVE
Glucose, UA: NEGATIVE
Ketones, UA: NEGATIVE
Leukocytes, UA: NEGATIVE
Nitrite, UA: NEGATIVE
Protein, UA: NEGATIVE
Spec Grav, UA: 1.01 (ref 1.010–1.025)
Urobilinogen, UA: 0.2 E.U./dL
pH, UA: 6.5 (ref 5.0–8.0)

## 2023-04-29 LAB — CBC WITH DIFFERENTIAL/PLATELET
Absolute Monocytes: 710 cells/uL (ref 200–950)
Eosinophils Absolute: 163 cells/uL (ref 15–500)
Eosinophils Relative: 2.2 %
MCH: 31 pg (ref 27.0–33.0)
MCHC: 33.4 g/dL (ref 32.0–36.0)
MCV: 92.7 fL (ref 80.0–100.0)
MPV: 11.9 fL (ref 7.5–12.5)
Monocytes Relative: 9.6 %
Neutro Abs: 4011 cells/uL (ref 1500–7800)
Neutrophils Relative %: 54.2 %
Platelets: 236 10*3/uL (ref 140–400)
RBC: 4.13 10*6/uL (ref 3.80–5.10)
RDW: 12.2 % (ref 11.0–15.0)

## 2023-04-29 LAB — COMPLETE METABOLIC PANEL WITH GFR
AG Ratio: 1.7 (calc) (ref 1.0–2.5)
ALT: 14 U/L (ref 6–29)
AST: 16 U/L (ref 10–35)
Albumin: 4.3 g/dL (ref 3.6–5.1)
Alkaline phosphatase (APISO): 72 U/L (ref 37–153)
BUN: 16 mg/dL (ref 7–25)
CO2: 27 mmol/L (ref 20–32)
Calcium: 9.8 mg/dL (ref 8.6–10.4)
Chloride: 108 mmol/L (ref 98–110)
Creat: 0.58 mg/dL (ref 0.50–1.03)
Globulin: 2.5 g/dL (calc) (ref 1.9–3.7)
Glucose, Bld: 77 mg/dL (ref 65–99)
Potassium: 4.7 mmol/L (ref 3.5–5.3)
Sodium: 142 mmol/L (ref 135–146)
Total Bilirubin: 0.4 mg/dL (ref 0.2–1.2)
Total Protein: 6.8 g/dL (ref 6.1–8.1)
eGFR: 109 mL/min/{1.73_m2} (ref >=60)

## 2023-04-29 LAB — LIPID PANEL
Cholesterol: 168 mg/dL (ref <200)
HDL: 57 mg/dL (ref >=50)
LDL Cholesterol (Calc): 94 mg/dL (calc)
Non-HDL Cholesterol (Calc): 111 mg/dL (calc) (ref <130)
Total CHOL/HDL Ratio: 2.9 (calc) (ref <5.0)
Triglycerides: 79 mg/dL (ref <150)

## 2023-04-29 LAB — TSH: TSH: 1.23 mIU/L

## 2023-04-29 MED ORDER — ALPRAZOLAM 0.5 MG PO TABS: ORAL_TABLET | ORAL | 1 refills | Status: DC

## 2023-04-29 MED ORDER — VITAMIN D (ERGOCALCIFEROL) 1.25 MG (50000 UNIT) PO CAPS: ORAL_CAPSULE | ORAL | 3 refills | Status: DC

## 2023-04-29 NOTE — Patient Instructions (Addendum)
It was a pleasure to see you today.  Continue rosuvastatin 10 mg daily.  Continue weekly vitamin D supplement.  This was refilled.  Labs are stable.  Please find a new gynecologist in your area.  Please go to pharmacy to have tetanus booster.  Colonoscopy is up-to-date.  Have annual mammogram.  Xanax has been refilled.

## 2023-05-16 ENCOUNTER — Other Ambulatory Visit: Payer: Self-pay | Admitting: Internal Medicine

## 2023-05-16 ENCOUNTER — Encounter: Payer: Self-pay | Admitting: Internal Medicine

## 2023-05-16 ENCOUNTER — Other Ambulatory Visit: Payer: Self-pay

## 2023-05-16 MED ORDER — ALPRAZOLAM 0.5 MG PO TABS: ORAL_TABLET | ORAL | 1 refills | Status: DC

## 2023-05-25 ENCOUNTER — Other Ambulatory Visit: Payer: Self-pay | Admitting: Internal Medicine

## 2023-07-17 DIAGNOSIS — R92323 Mammographic fibroglandular density, bilateral breasts: Secondary | ICD-10-CM | POA: Diagnosis not present

## 2023-07-17 DIAGNOSIS — Z1231 Encounter for screening mammogram for malignant neoplasm of breast: Secondary | ICD-10-CM | POA: Diagnosis not present

## 2023-07-17 LAB — HM MAMMOGRAPHY

## 2023-07-18 ENCOUNTER — Encounter: Payer: Self-pay | Admitting: Internal Medicine

## 2023-09-17 DIAGNOSIS — K5732 Diverticulitis of large intestine without perforation or abscess without bleeding: Secondary | ICD-10-CM | POA: Diagnosis not present

## 2023-09-17 DIAGNOSIS — R110 Nausea: Secondary | ICD-10-CM | POA: Diagnosis not present

## 2023-09-17 DIAGNOSIS — R197 Diarrhea, unspecified: Secondary | ICD-10-CM | POA: Diagnosis not present

## 2023-09-17 DIAGNOSIS — D72829 Elevated white blood cell count, unspecified: Secondary | ICD-10-CM | POA: Diagnosis not present

## 2023-09-17 DIAGNOSIS — K5792 Diverticulitis of intestine, part unspecified, without perforation or abscess without bleeding: Secondary | ICD-10-CM | POA: Diagnosis not present

## 2023-09-17 DIAGNOSIS — R1030 Lower abdominal pain, unspecified: Secondary | ICD-10-CM | POA: Diagnosis not present

## 2023-09-17 DIAGNOSIS — R1032 Left lower quadrant pain: Secondary | ICD-10-CM | POA: Diagnosis not present

## 2023-09-17 LAB — LAB REPORT - SCANNED: EGFR: 90

## 2023-09-18 DIAGNOSIS — K5792 Diverticulitis of intestine, part unspecified, without perforation or abscess without bleeding: Secondary | ICD-10-CM | POA: Diagnosis not present

## 2023-09-18 DIAGNOSIS — R1032 Left lower quadrant pain: Secondary | ICD-10-CM | POA: Diagnosis not present

## 2023-09-22 DIAGNOSIS — K5732 Diverticulitis of large intestine without perforation or abscess without bleeding: Secondary | ICD-10-CM | POA: Diagnosis not present

## 2023-12-19 ENCOUNTER — Other Ambulatory Visit: Payer: Self-pay | Admitting: Internal Medicine

## 2024-01-27 ENCOUNTER — Other Ambulatory Visit: Payer: Self-pay | Admitting: Internal Medicine

## 2024-03-02 DIAGNOSIS — M205X1 Other deformities of toe(s) (acquired), right foot: Secondary | ICD-10-CM | POA: Diagnosis not present

## 2024-03-02 DIAGNOSIS — L84 Corns and callosities: Secondary | ICD-10-CM | POA: Diagnosis not present

## 2024-03-02 DIAGNOSIS — M722 Plantar fascial fibromatosis: Secondary | ICD-10-CM | POA: Diagnosis not present

## 2024-03-02 DIAGNOSIS — M7672 Peroneal tendinitis, left leg: Secondary | ICD-10-CM | POA: Diagnosis not present

## 2024-03-18 ENCOUNTER — Other Ambulatory Visit: Payer: Self-pay | Admitting: Internal Medicine

## 2024-04-23 ENCOUNTER — Telehealth: Payer: Self-pay | Admitting: *Deleted

## 2024-04-23 NOTE — Telephone Encounter (Signed)
 Copied from CRM [id number]. Topic: Clinical - Request for Lab/Test Order >> Apr 22, 2024 10:38 AM Lizabeth Riggs wrote: Reason for CRM:  Brian Campanile would like to come in a week before her appointment on May 6 to complete her labs. Please place orders in her chart and call her at [phone] to set an appointment for labs. Thanks

## 2024-04-26 NOTE — Telephone Encounter (Signed)
 Patient scheduled for this Friday for CPE labs

## 2024-04-27 DIAGNOSIS — M7672 Peroneal tendinitis, left leg: Secondary | ICD-10-CM | POA: Diagnosis not present

## 2024-04-27 DIAGNOSIS — Q667 Congenital pes cavus, unspecified foot: Secondary | ICD-10-CM | POA: Diagnosis not present

## 2024-04-27 DIAGNOSIS — M7671 Peroneal tendinitis, right leg: Secondary | ICD-10-CM | POA: Diagnosis not present

## 2024-04-27 DIAGNOSIS — M19071 Primary osteoarthritis, right ankle and foot: Secondary | ICD-10-CM | POA: Diagnosis not present

## 2024-04-28 NOTE — Progress Notes (Signed)
 Annual Wellness Visit   Patient Care Team: Sylvan Evener, MD as PCP - General (Internal Medicine)  Visit Date: 05/04/24   Chief Complaint  Patient presents with   Annual Exam   Subjective:  Patient: Cheryl Watson, Female DOB: 09-02-1971, 53 y.o. MRN: [id number] Cheryl Watson is a 53 y.o. Female who presents today for her Annual Wellness Visit. Patient has History Of Smoking; Bilateral Carpal Tunnel Syndrome; and Abnormal LFTs.  History of Hyperlipidemia treated with  Rosuvastatin  10 mg daily. 04/30/2024 Lipid Panel: WNL. 2023 Coronary Cardiac Score: 63.   History of Anxiety treated with Xanax  0.5 mg as needed.   History of Vitamin-D Deficiency treated with Vitamin-D 50,000 units weekly  BMI 28.46, weight 179 today. In 03/2023 was 176 pounds, BMI 27.98; 08/2022 was 181 pounds 6.4 oz, BMI 28.41; 05/2022 178 pounds, BMI 27.88. Says she would like to start Zepbound.   History of Smoking, currently 0.25 PPD x 30 years. No incidental pulmonary findings on 2023 over-read CXR for coronary calcium  scoring.     Labs 04/30/2024 CBC: WNL CMP: WNL  TSH: 1.89  Overdue for repeat PAP Smear since 2024; last completed 04/17/2020  normal.  Mammogram 07/18/2023  normal  with repeat recommendation of 2025.  Colonoscopy 10/17/2021  showed Diverticulosis in the left colon; otherwise normal with repeat recommendation of 2032. Was seen in ED 08/2023 for Diverticulitis and says that she also has had a flare-up a couple of months ago and is currently having a flare-up today as well. Says that she does take Miralax daily to prevent/relieve constipation.   Bone Density will be due 2036.   Vaccine Counseling: Due for Covid-19, Shingles 1/2, and PNA; UTD on Flu and Tdap. Past Medical History:  Diagnosis Date   Amenorrhea    Anxiety    Back pain    Constipation    Dysmenorrhea    Joint pain    Palpitation    Shortness of breath    Urinary incontinence    Vitamin D  deficiency     Medical/Surgical History Narrative:  Allergic/Intolerant to: Penicillins, Ampicillin, & Cillin derivatives   2023 - Seen in ED for Right Flank Pain; CT showed punctuate bilateral kidney stones w/o obstructions. It was believed she passed a small stone or had a small stone at that time. Was treated conservatively with IV fluids and pain meds in ED.   2022 - Saw Dr. Jertson for evaluation of amenorrhea: was found to be perimenopausal, symptoms treated with Provera .   2001 - Cholecystectomy   Other - Surghx of: Cesarean Section; Tubal Ligation Family History  Problem Relation Age of Onset   Depression Mother    Obesity Mother    Neuropathy Father    Alcohol abuse Father    High blood pressure Father    High Cholesterol Father    Depression Father    Sleep apnea Father    Drug abuse Father    Breast cancer Paternal Aunt 37   Colon cancer Paternal Uncle    Esophageal cancer Other    Rectal cancer Neg Hx    Stomach cancer Neg Hx    Family History Narrative: No family history of Stomach or Rectal Cancer. Father, deceased aged 4 in January 29, 2020 due of Complications from Pneumonia (not Covid-19) w/ hx of Alcoholism, Drug Abuse, High Cholesterol, High Blood Pressure, Peripheral Neuropathy, Brain Atrophy, Sleep Apnea, and Depression Paternal Aunt w/ hx of Breast Cancer (onset age 13) Paternal Uncle w/ hx of Colon  Cancer Mother, living as far as is known but estranged, w/ hx of Obesity and Depression Unknown Relation w/ hx of Esophageal Cancer Half-Brother, deceased in 06/28/2019 due to an overdose Step-Father, deceased of an apparent MI in 2019/08/28 Social History   Social History Narrative   4-year college degree. Works as a Therapist, music for [employer]. Not much time for exercise. Watson Smoker - 0.25 PPD x 30 years; rare alcohol consumption. Widowed - husband deceased due to cirrhosis of the liver. 3 children - 1 daughter (53 y.o in 40) & 2  sons (40 y.o & 50 y.o in 2025).   Review of Systems  Constitutional:  Negative for chills, fever, malaise/fatigue and weight loss.  HENT:  Negative for hearing loss, sinus pain and sore throat.   Respiratory:  Negative for cough, hemoptysis and shortness of breath.   Cardiovascular:  Negative for chest pain, palpitations, leg swelling and PND.  Gastrointestinal:  Positive for abdominal pain (attributed to Diverticultiis). Negative for constipation, diarrhea, heartburn, nausea and vomiting.  Genitourinary:  Negative for dysuria, frequency and urgency.  Musculoskeletal:  Negative for back pain, myalgias and neck pain.  Skin:  Negative for itching and rash.  Neurological:  Negative for dizziness, tingling, seizures and headaches.  Endo/Heme/Allergies:  Negative for polydipsia.  Psychiatric/Behavioral:  Negative for depression. The patient is not nervous/anxious.     Objective:  Vitals:  Today's Vitals   05/04/24 1023  BP: 98/70  Weight: 179 lb (81.2 kg)  Height: 5' 6.5" (1.689 m)  PainSc: 4   PainLoc: Abdomen   Physical Exam Vitals and nursing note reviewed.  Constitutional:      General: She is not in acute distress.    Appearance: Normal appearance. She is not ill-appearing or toxic-appearing.  HENT:     Head: Normocephalic and atraumatic.     Right Ear: Hearing, tympanic membrane, ear canal and external ear normal.     Left Ear: Hearing, tympanic membrane, ear canal and external ear normal.     Mouth/Throat:     Pharynx: Oropharynx is clear.  Eyes:     Extraocular Movements: Extraocular movements intact.     Pupils: Pupils are equal, round, and reactive to light.  Neck:     Thyroid : No thyroid  mass, thyromegaly or thyroid  tenderness.     Vascular: No carotid bruit.  Cardiovascular:     Rate and Rhythm: Normal rate and regular rhythm. No extrasystoles are present.    Pulses:          Dorsalis pedis pulses are 2+ on the right side and 2+ on the left side.     Heart sounds:  Normal heart sounds, S1 normal and S2 normal. No murmur heard.    No friction rub. No gallop.  Pulmonary:     Effort: Pulmonary effort is normal.     Breath sounds: Normal breath sounds. No decreased breath sounds, wheezing, rhonchi or rales.  Chest:     Chest wall: No mass.  Breasts:    Right: Normal. No mass or tenderness.     Left: Normal. No mass or tenderness.  Abdominal:     Palpations: Abdomen is soft. There is no hepatomegaly, splenomegaly or mass.     Tenderness: There is no abdominal tenderness.     Hernia: No hernia is present.  Musculoskeletal:     Cervical back: Normal range of motion.     Right lower leg: No edema.  Left lower leg: No edema.  Lymphadenopathy:     Cervical: No cervical adenopathy.     Upper Body:     Right upper body: No supraclavicular adenopathy.     Left upper body: No supraclavicular adenopathy.  Skin:    General: Skin is warm and dry.  Neurological:     General: No focal deficit present.     Mental Status: She is alert and oriented to person, place, and time. Mental status is at baseline.     Sensory: Sensation is intact.     Motor: Motor function is intact. No weakness.     Deep Tendon Reflexes: Reflexes are normal and symmetric.  Psychiatric:        Attention and Perception: Attention normal.        Mood and Affect: Mood normal.        Speech: Speech normal.        Behavior: Behavior normal.        Thought Content: Thought content normal.        Cognition and Memory: Cognition normal.        Judgment: Judgment normal.   Most Recent Fall Risk Assessment:    04/29/2023   10:58 AM  Fall Risk   Falls in the past year? 0  Number falls in past yr: 0  Injury with Fall? 0  Risk for fall due to : No Fall Risks  Follow up Falls prevention discussed   Most Recent Depression Screenings:    05/04/2024   10:27 AM 04/29/2023   10:58 AM  PHQ 2/9 Scores  PHQ - 2 Score 0 0   Results:  Studies Obtained And Personally Reviewed By Me:  2023  Coronary Cardiac Score: 63. No incidental pulmonary findings on over-read.   Overdue for repeat PAP Smear since 2024; last completed 04/17/2020  normal.  Mammogram 07/18/2023  normal  with repeat recommendation of 2025.  Colonoscopy 10/17/2021  showed Diverticulosis in the left colon; otherwise normal with repeat recommendation of 2032.  Labs:     Component Value Date/Time   NA 140 04/30/2024 0927   K 5.3 04/30/2024 0927   CL 106 04/30/2024 0927   CO2 27 04/30/2024 0927   GLUCOSE 86 04/30/2024 0927   BUN 22 04/30/2024 0927   CREATININE 0.65 04/30/2024 0927   CALCIUM  9.6 04/30/2024 0927   PROT 6.5 04/30/2024 0927   ALBUMIN 3.8 05/27/2017 1035   AST 15 04/30/2024 0927   ALT 22 04/30/2024 0927   ALKPHOS 52 05/27/2017 1035   BILITOT 0.4 04/30/2024 0927   GFRNONAA 105 04/19/2021 0937   GFRAA 122 04/19/2021 0937    Lab Results  Component Value Date   WBC 8.9 04/30/2024   HGB 13.0 04/30/2024   HCT 39.3 04/30/2024   MCV 93.8 04/30/2024   PLT 279 04/30/2024   Lab Results  Component Value Date   CHOL 151 04/30/2024   HDL 57 04/30/2024   LDLCALC 74 04/30/2024   TRIG 116 04/30/2024   CHOLHDL 2.6 04/30/2024   Lab Results  Component Value Date   HGBA1C 5.1 04/19/2021    Lab Results  Component Value Date   TSH 1.89 04/30/2024    Assessment & Plan:   Orders Placed This Encounter  Procedures   Pneumococcal conjugate vaccine 20-valent (Prevnar 20)   POCT URINALYSIS DIP (CLINITEK)  No orders of the defined types were placed in this encounter. Other Labs Reviewed today: CBC: WNL CMP: WNL  TSH: 1.89  Hyperlipidemia treated with  Rosuvastatin  10 mg daily. 04/30/2024 Lipid Panel: WNL. 2023 Coronary Calcium  Score: 63.   Anxiety treated with Xanax  0.5 mg as needed.   Vitamin-D Deficiency treated with Vitamin-D 50,000 units weekly  BMI 28.46, weight 179 today. In 03/2023 was 176 pounds, BMI 27.98; 08/2022 was 181 pounds 6.4 oz, BMI 28.41; 05/2022 178 pounds, BMI 27.88. Says she  would like to start Zepbound.   History of Smoking, currently 0.25 PPD x 30 years. No incidental pulmonary findings on 2023 over-read CXR for coronary calcium  scoring. Not ready to quit.  Since    Overdue for repeat PAP Smear since 2024; last completed 04/17/2020  normal.  Mammogram 07/18/2023  normal  with repeat recommendation of 2025.  Colonoscopy 10/17/2021  showed Diverticulosis in the left colon; otherwise normal with repeat recommendation of 2032.   Was seen in ED 08/2023 for Diverticulitis and says that she also has had a flare-up a couple of months ago and is currently having a flare-up today as well. Says that she does take Miralax daily to prevent/relieve constipation.     Vaccine Counseling: Due for Covid-19, Shingles 1/2, and PNA - PNA updated today; UTD on Flu and Tdap.   Annual wellness visit done today including the all of the following: Reviewed patient's Family Medical History Reviewed and updated list of patient's medical providers Assessment of cognitive impairment was done Assessed patient's functional ability Established a written schedule for health screening services Health Risk Assessent Completed and Reviewed  Discussed health benefits of physical activity, and encouraged her to engage in regular exercise appropriate for her age and condition.    I,Emily Lagle,acting as a Neurosurgeon for Sylvan Evener, MD.,have documented all relevant documentation on the behalf of Sylvan Evener, MD,as directed by  Sylvan Evener, MD while in the presence of Sylvan Evener, MD.   I, Sylvan Evener, MD, have reviewed all documentation for this visit. The documentation on 05/09/24 for the exam, diagnosis, procedures, and orders are all accurate and complete.

## 2024-04-30 ENCOUNTER — Other Ambulatory Visit

## 2024-04-30 DIAGNOSIS — Z136 Encounter for screening for cardiovascular disorders: Secondary | ICD-10-CM | POA: Diagnosis not present

## 2024-04-30 DIAGNOSIS — E7849 Other hyperlipidemia: Secondary | ICD-10-CM | POA: Diagnosis not present

## 2024-04-30 DIAGNOSIS — R5383 Other fatigue: Secondary | ICD-10-CM

## 2024-05-01 LAB — CBC WITH DIFFERENTIAL/PLATELET
Absolute Lymphocytes: 3213 {cells}/uL (ref 850–3900)
Absolute Monocytes: 810 {cells}/uL (ref 200–950)
Basophils Absolute: 36 {cells}/uL (ref 0–200)
Basophils Relative: 0.4 %
Eosinophils Absolute: 134 {cells}/uL (ref 15–500)
Eosinophils Relative: 1.5 %
HCT: 39.3 % (ref 35.0–45.0)
Hemoglobin: 13 g/dL (ref 11.7–15.5)
MCH: 31 pg (ref 27.0–33.0)
MCHC: 33.1 g/dL (ref 32.0–36.0)
MCV: 93.8 fL (ref 80.0–100.0)
MPV: 11.4 fL (ref 7.5–12.5)
Monocytes Relative: 9.1 %
Neutro Abs: 4708 {cells}/uL (ref 1500–7800)
Neutrophils Relative %: 52.9 %
Platelets: 279 10*3/uL (ref 140–400)
RBC: 4.19 10*6/uL (ref 3.80–5.10)
RDW: 12.2 % (ref 11.0–15.0)
Total Lymphocyte: 36.1 %
WBC: 8.9 10*3/uL (ref 3.8–10.8)

## 2024-05-01 LAB — COMPLETE METABOLIC PANEL WITHOUT GFR
AG Ratio: 2 (calc) (ref 1.0–2.5)
ALT: 22 U/L (ref 6–29)
AST: 15 U/L (ref 10–35)
Albumin: 4.3 g/dL (ref 3.6–5.1)
Alkaline phosphatase (APISO): 79 U/L (ref 37–153)
BUN: 22 mg/dL (ref 7–25)
CO2: 27 mmol/L (ref 20–32)
Calcium: 9.6 mg/dL (ref 8.6–10.4)
Chloride: 106 mmol/L (ref 98–110)
Creat: 0.65 mg/dL (ref 0.50–1.03)
Globulin: 2.2 g/dL (ref 1.9–3.7)
Glucose, Bld: 86 mg/dL (ref 65–99)
Potassium: 5.3 mmol/L (ref 3.5–5.3)
Sodium: 140 mmol/L (ref 135–146)
Total Bilirubin: 0.4 mg/dL (ref 0.2–1.2)
Total Protein: 6.5 g/dL (ref 6.1–8.1)

## 2024-05-01 LAB — LIPID PANEL
Cholesterol: 151 mg/dL (ref <200)
HDL: 57 mg/dL (ref >=50)
LDL Cholesterol (Calc): 74 mg/dL
Non-HDL Cholesterol (Calc): 94 mg/dL (ref <130)
Total CHOL/HDL Ratio: 2.6 (calc) (ref <5.0)
Triglycerides: 116 mg/dL (ref <150)

## 2024-05-01 LAB — TSH: TSH: 1.89 m[IU]/L

## 2024-05-03 ENCOUNTER — Encounter: Payer: Self-pay | Admitting: Internal Medicine

## 2024-05-04 ENCOUNTER — Ambulatory Visit: Payer: Federal, State, Local not specified - PPO | Admitting: Internal Medicine

## 2024-05-04 ENCOUNTER — Encounter: Payer: Self-pay | Admitting: Internal Medicine

## 2024-05-04 VITALS — BP 98/70 | Ht 66.5 in | Wt 179.0 lb

## 2024-05-04 DIAGNOSIS — E559 Vitamin D deficiency, unspecified: Secondary | ICD-10-CM | POA: Diagnosis not present

## 2024-05-04 DIAGNOSIS — I2510 Atherosclerotic heart disease of native coronary artery without angina pectoris: Secondary | ICD-10-CM

## 2024-05-04 DIAGNOSIS — R7302 Impaired glucose tolerance (oral): Secondary | ICD-10-CM

## 2024-05-04 DIAGNOSIS — E785 Hyperlipidemia, unspecified: Secondary | ICD-10-CM

## 2024-05-04 DIAGNOSIS — Z0000 Encounter for general adult medical examination without abnormal findings: Secondary | ICD-10-CM

## 2024-05-04 DIAGNOSIS — Z6828 Body mass index (BMI) 28.0-28.9, adult: Secondary | ICD-10-CM

## 2024-05-04 DIAGNOSIS — F17200 Nicotine dependence, unspecified, uncomplicated: Secondary | ICD-10-CM

## 2024-05-04 DIAGNOSIS — F419 Anxiety disorder, unspecified: Secondary | ICD-10-CM | POA: Diagnosis not present

## 2024-05-04 DIAGNOSIS — Z8719 Personal history of other diseases of the digestive system: Secondary | ICD-10-CM

## 2024-05-04 DIAGNOSIS — F17210 Nicotine dependence, cigarettes, uncomplicated: Secondary | ICD-10-CM

## 2024-05-04 DIAGNOSIS — Z8639 Personal history of other endocrine, nutritional and metabolic disease: Secondary | ICD-10-CM

## 2024-05-04 DIAGNOSIS — Z23 Encounter for immunization: Secondary | ICD-10-CM | POA: Diagnosis not present

## 2024-05-04 LAB — POCT URINALYSIS DIP (CLINITEK)
Bilirubin, UA: NEGATIVE
Blood, UA: NEGATIVE
Glucose, UA: NEGATIVE mg/dL
Ketones, POC UA: NEGATIVE mg/dL
Leukocytes, UA: NEGATIVE
Nitrite, UA: NEGATIVE
POC PROTEIN,UA: NEGATIVE
Spec Grav, UA: <=1.005 (ref 1.010–1.025)
Urobilinogen, UA: 0.2 U/dL
pH, UA: 6.5 (ref 5.0–8.0)

## 2024-05-05 LAB — MICROALBUMIN / CREATININE URINE RATIO
Creatinine, Urine: 17 mg/dL — ABNORMAL LOW (ref 20–275)
Microalb, Ur: <0.2 mg/dL

## 2024-05-09 ENCOUNTER — Encounter: Payer: Self-pay | Admitting: Internal Medicine

## 2024-05-09 NOTE — Patient Instructions (Addendum)
 It was a pleasure to see you today.  Labs are stable.  Continue current medications and return in 1 year or as needed.  Please stop smoking.  GLP-1 therapy being considered.  Form completed for Zepbound as requested.  Pneumococcal 20 vaccine given.

## 2024-06-21 ENCOUNTER — Other Ambulatory Visit: Payer: Self-pay | Admitting: Internal Medicine

## 2024-06-21 NOTE — Telephone Encounter (Signed)
 Medication: Xanax  Directions: TAKE 1 TABLET BY MOUTH AT BEDTIME AS NEEDED FOR ANXIETY  Last given: 03/18/2024 Number refills: 0 Last o/v: 05/04/2024 Follow up: NA Labs:  05/04/2024

## 2024-07-30 DIAGNOSIS — J0140 Acute pansinusitis, unspecified: Secondary | ICD-10-CM | POA: Diagnosis not present

## 2024-12-21 ENCOUNTER — Other Ambulatory Visit: Payer: Self-pay | Admitting: Internal Medicine
